# Patient Record
Sex: Female | Born: 1985 | Race: White | Hispanic: No | Marital: Single | Smoking: Never smoker
Health system: Southern US, Community
[De-identification: ages and names within clinical notes are randomized; demographics above are authoritative.]

## PROBLEM LIST (undated history)

## (undated) DIAGNOSIS — T8859XA Other complications of anesthesia, initial encounter: Secondary | ICD-10-CM

## (undated) DIAGNOSIS — K219 Gastro-esophageal reflux disease without esophagitis: Secondary | ICD-10-CM

## (undated) DIAGNOSIS — T4145XA Adverse effect of unspecified anesthetic, initial encounter: Secondary | ICD-10-CM

---
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
## 2006-02-05 ENCOUNTER — Inpatient Hospital Stay: Payer: Self-pay | Admitting: Obstetrics and Gynecology

## 2015-01-15 NOTE — H&P (Signed)
Brittany Oconnell is a 29 y.o. female presenting for elective repeat c/s + BTL + on Q pump. History prior c[facility]s . [facility] and confirmatory U/S .  OB History    No data available     No past medical history on file.- none  No past surgical history on file. Prior c/s   Family History: family history is not on file. Social History:  has no tobacco, alcohol, and drug history on file.  Meds : pnv Allergies : allergy    Prenatal Transfer Tool  Maternal Diabetes: No Genetic Screening: Declined Maternal Ultrasounds/Referrals: Normal Fetal Ultrasounds or other Referrals:  None Maternal Substance Abuse:  No Significant Maternal Medications:  None Significant Maternal Lab Results:  None Other Comments:  None  ROSunremarkable     There were no vitals taken for this visit. Exam Physical Exam BP 109/71 BMI - 36.2 Lungs CTA  CV RRR without murmur GI : gravid , soft NT  Pelvic : cx closed  Prenatal labs: ABO, Rh:  O+ Antibody:  neg Rubella:  Immune RPR:   NR HBsAg:   neg HIV:   neg GBS:     Assessment/Plan: Elective repeat LTCS and sterilization  02/11/15. On Q pump     SCHERMERHORN,THOMAS 01/15/2015, 11:08 AM

## 2015-02-08 ENCOUNTER — Encounter
Admission: RE | Admit: 2015-02-08 | Discharge: 2015-02-08 | Disposition: A | Discharge status: Home / Self Care | Payer: 59 | Source: Ambulatory Visit | Attending: Obstetrics and Gynecology | Admitting: Obstetrics and Gynecology

## 2015-02-08 ENCOUNTER — Other Ambulatory Visit: Payer: Self-pay

## 2015-02-08 HISTORY — DX: Gastro-esophageal reflux disease without esophagitis: K21.9

## 2015-02-08 HISTORY — DX: Adverse effect of unspecified anesthetic, initial encounter: T41.45XA

## 2015-02-08 HISTORY — DX: Other complications of anesthesia, initial encounter: T88.59XA

## 2015-02-08 LAB — RAPID HIV SCREEN (HIV 1/2 AB+AG)
HIV 1/2 ANTIBODIES: NONREACTIVE
HIV-1 P24 ANTIGEN - HIV24: NONREACTIVE

## 2015-02-08 LAB — CBC
HCT: 34.7 % — ABNORMAL LOW (ref 35.0–47.0)
HEMOGLOBIN: 11.8 g/dL — AB (ref 12.0–16.0)
MCH: 30.7 pg (ref 26.0–34.0)
MCHC: 33.9 g/dL (ref 32.0–36.0)
MCV: 90.3 fL (ref 80.0–100.0)
PLATELETS: 127 10*3/uL — AB (ref 150–440)
RBC: 3.84 MIL/uL (ref 3.80–5.20)
RDW: 14.2 % (ref 11.5–14.5)
WBC: 9.1 10*3/uL (ref 3.6–11.0)

## 2015-02-08 LAB — TYPE AND SCREEN
ABO/RH(D): O POS
Antibody Screen: NEGATIVE
EXTEND SAMPLE REASON: UNDETERMINED

## 2015-02-08 LAB — ABO/RH: ABO/RH(D): O POS

## 2015-02-09 LAB — RPR: RPR: NONREACTIVE

## 2015-02-11 ENCOUNTER — Inpatient Hospital Stay: Payer: 59 | Admitting: Anesthesiology

## 2015-02-11 ENCOUNTER — Encounter: Payer: Self-pay | Admitting: Anesthesiology

## 2015-02-11 ENCOUNTER — Inpatient Hospital Stay
Admission: RE | Admit: 2015-02-11 | Discharge: 2015-02-17 | DRG: 765 | Disposition: A | Discharge status: Home / Self Care | Payer: 59 | Source: Ambulatory Visit | Attending: Obstetrics and Gynecology | Admitting: Obstetrics and Gynecology

## 2015-02-11 ENCOUNTER — Encounter
Admission: RE | Disposition: A | Discharge status: Home / Self Care | Payer: Self-pay | Source: Ambulatory Visit | Attending: Obstetrics and Gynecology

## 2015-02-11 DIAGNOSIS — E876 Hypokalemia: Secondary | ICD-10-CM | POA: Diagnosis present

## 2015-02-11 DIAGNOSIS — J9811 Atelectasis: Secondary | ICD-10-CM | POA: Diagnosis not present

## 2015-02-11 DIAGNOSIS — E873 Alkalosis: Secondary | ICD-10-CM | POA: Diagnosis not present

## 2015-02-11 DIAGNOSIS — E860 Dehydration: Secondary | ICD-10-CM | POA: Diagnosis not present

## 2015-02-11 DIAGNOSIS — O34211 Maternal care for low transverse scar from previous cesarean delivery: Principal | ICD-10-CM | POA: Diagnosis present

## 2015-02-11 DIAGNOSIS — K219 Gastro-esophageal reflux disease without esophagitis: Secondary | ICD-10-CM | POA: Diagnosis present

## 2015-02-11 DIAGNOSIS — O9902 Anemia complicating childbirth: Secondary | ICD-10-CM | POA: Diagnosis not present

## 2015-02-11 DIAGNOSIS — R109 Unspecified abdominal pain: Secondary | ICD-10-CM

## 2015-02-11 DIAGNOSIS — Z302 Encounter for sterilization: Secondary | ICD-10-CM | POA: Diagnosis not present

## 2015-02-11 DIAGNOSIS — O9962 Diseases of the digestive system complicating childbirth: Secondary | ICD-10-CM | POA: Diagnosis present

## 2015-02-11 DIAGNOSIS — O34219 Maternal care for unspecified type scar from previous cesarean delivery: Secondary | ICD-10-CM | POA: Diagnosis present

## 2015-02-11 DIAGNOSIS — K567 Ileus, unspecified: Secondary | ICD-10-CM | POA: Diagnosis not present

## 2015-02-11 DIAGNOSIS — R112 Nausea with vomiting, unspecified: Secondary | ICD-10-CM

## 2015-02-11 DIAGNOSIS — Z9889 Other specified postprocedural states: Secondary | ICD-10-CM

## 2015-02-11 DIAGNOSIS — I313 Pericardial effusion (noninflammatory): Secondary | ICD-10-CM | POA: Diagnosis not present

## 2015-02-11 DIAGNOSIS — D62 Acute posthemorrhagic anemia: Secondary | ICD-10-CM | POA: Diagnosis not present

## 2015-02-11 DIAGNOSIS — K9189 Other postprocedural complications and disorders of digestive system: Secondary | ICD-10-CM | POA: Diagnosis not present

## 2015-02-11 DIAGNOSIS — Z01818 Encounter for other preprocedural examination: Secondary | ICD-10-CM

## 2015-02-11 DIAGNOSIS — Z98891 History of uterine scar from previous surgery: Secondary | ICD-10-CM

## 2015-02-11 LAB — CBC
HEMATOCRIT: 32.6 % — AB (ref 35.0–47.0)
Hemoglobin: 10.9 g/dL — ABNORMAL LOW (ref 12.0–16.0)
MCH: 30.3 pg (ref 26.0–34.0)
MCHC: 33.3 g/dL (ref 32.0–36.0)
MCV: 91.1 fL (ref 80.0–100.0)
Platelets: 114 10*3/uL — ABNORMAL LOW (ref 150–440)
RBC: 3.58 MIL/uL — ABNORMAL LOW (ref 3.80–5.20)
RDW: 14 % (ref 11.5–14.5)
WBC: 7.3 10*3/uL (ref 3.6–11.0)

## 2015-02-11 LAB — RAPID HIV SCREEN (HIV 1/2 AB+AG)
HIV 1/2 ANTIBODIES: NONREACTIVE
HIV-1 P24 Antigen - HIV24: NONREACTIVE

## 2015-02-11 SURGERY — Surgical Case
Anesthesia: General | Site: Abdomen | Laterality: Bilateral | Wound class: Clean Contaminated

## 2015-02-11 MED ORDER — ONDANSETRON HCL 4 MG/2ML IJ SOLN: 4.0000 mg | Freq: Three times a day (TID) | INTRAMUSCULAR | Status: DC | PRN

## 2015-02-11 MED ORDER — LACTATED RINGERS IV SOLN: 500.0000 mL | INTRAVENOUS | Status: DC | PRN

## 2015-02-11 MED ORDER — NALBUPHINE HCL 10 MG/ML IJ SOLN: 5.0000 mg | Freq: Once | INTRAMUSCULAR | Status: DC | PRN

## 2015-02-11 MED ORDER — DIPHENHYDRAMINE HCL 25 MG PO CAPS: 25.0000 mg | ORAL_CAPSULE | ORAL | Status: DC | PRN

## 2015-02-11 MED ORDER — IBUPROFEN 600 MG PO TABS
600.0000 mg | ORAL_TABLET | Freq: Four times a day (QID) | ORAL | Status: DC
  Administered 2015-02-12 – 2015-02-13 (×6): 600 mg via ORAL
  Filled 2015-02-11 (×7): qty 1

## 2015-02-11 MED ORDER — ONDANSETRON HCL 4 MG/2ML IJ SOLN: 4.0000 mg | Freq: Four times a day (QID) | INTRAMUSCULAR | Status: DC | PRN

## 2015-02-11 MED ORDER — CEFAZOLIN SODIUM-DEXTROSE 2-3 GM-% IV SOLR
INTRAVENOUS | Status: AC
  Administered 2015-02-11: 2 g via INTRAVENOUS
  Filled 2015-02-11: qty 50

## 2015-02-11 MED ORDER — LACTATED RINGERS IV SOLN
500.0000 mL | INTRAVENOUS | Status: DC | PRN
  Administered 2015-02-11: 08:00:00 via INTRAVENOUS

## 2015-02-11 MED ORDER — LANOLIN HYDROUS EX OINT: 1.0000 "application " | TOPICAL_OINTMENT | CUTANEOUS | Status: DC | PRN

## 2015-02-11 MED ORDER — NALBUPHINE HCL 10 MG/ML IJ SOLN: 5.0000 mg | INTRAMUSCULAR | Status: DC | PRN

## 2015-02-11 MED ORDER — ACETAMINOPHEN 325 MG PO TABS: 650.0000 mg | ORAL_TABLET | ORAL | Status: DC | PRN

## 2015-02-11 MED ORDER — BUPIVACAINE 0.25 % ON-Q PUMP DUAL CATH 300 ML
INJECTION | Status: DC | PRN
  Administered 2015-02-11: 300 mL

## 2015-02-11 MED ORDER — PROMETHAZINE HCL 25 MG/ML IJ SOLN: 6.2500 mg | INTRAMUSCULAR | Status: DC | PRN

## 2015-02-11 MED ORDER — CITRIC ACID-SODIUM CITRATE 334-500 MG/5ML PO SOLN
30.0000 mL | ORAL | Status: DC | PRN
  Administered 2015-02-11: 30 mL via ORAL
  Filled 2015-02-11: qty 15

## 2015-02-11 MED ORDER — DIBUCAINE 1 % RE OINT: 1.0000 "application " | TOPICAL_OINTMENT | RECTAL | Status: DC | PRN

## 2015-02-11 MED ORDER — FENTANYL CITRATE (PF) 100 MCG/2ML IJ SOLN
25.0000 ug | INTRAMUSCULAR | Status: DC | PRN
  Administered 2015-02-11 (×2): 50 ug via INTRAVENOUS

## 2015-02-11 MED ORDER — DIPHENHYDRAMINE HCL 25 MG PO CAPS
25.0000 mg | ORAL_CAPSULE | Freq: Four times a day (QID) | ORAL | Status: DC | PRN
Start: 2015-02-11 — End: 2015-02-11

## 2015-02-11 MED ORDER — NALOXONE HCL 2 MG/2ML IJ SOSY: 1.0000 ug/kg/h | PREFILLED_SYRINGE | INTRAVENOUS | Status: DC | PRN

## 2015-02-11 MED ORDER — LIDOCAINE HCL (CARDIAC) 20 MG/ML IV SOLN
INTRAVENOUS | Status: DC | PRN
  Administered 2015-02-11: 40 mg via INTRAVENOUS

## 2015-02-11 MED ORDER — PENICILLIN G POTASSIUM 5000000 UNITS IJ SOLR: 5.0000 10*6.[IU] | Freq: Once | INTRAVENOUS | Status: DC

## 2015-02-11 MED ORDER — SODIUM CHLORIDE 0.9 % IJ SOLN: 3.0000 mL | INTRAMUSCULAR | Status: DC | PRN

## 2015-02-11 MED ORDER — BUPIVACAINE 0.25 % ON-Q PUMP SINGLE CATH 400 ML
400.0000 mL | INJECTION | Status: DC
  Filled 2015-02-11: qty 400

## 2015-02-11 MED ORDER — OXYTOCIN 40 UNITS IN LACTATED RINGERS INFUSION - SIMPLE MED
62.5000 mL/h | INTRAVENOUS | Status: DC
  Administered 2015-02-11: 62.5 mL/h via INTRAVENOUS
  Administered 2015-02-11: 40 mL via INTRAVENOUS
  Filled 2015-02-11: qty 1000

## 2015-02-11 MED ORDER — OXYCODONE-ACETAMINOPHEN 5-325 MG PO TABS
2.0000 | ORAL_TABLET | ORAL | Status: DC | PRN
  Administered 2015-02-12 – 2015-02-15 (×13): 2 via ORAL
  Filled 2015-02-11 (×14): qty 2

## 2015-02-11 MED ORDER — SUCCINYLCHOLINE CHLORIDE 20 MG/ML IJ SOLN
INTRAMUSCULAR | Status: DC | PRN
  Administered 2015-02-11: 100 mg via INTRAVENOUS

## 2015-02-11 MED ORDER — MORPHINE SULFATE (PF) 0.5 MG/ML IJ SOLN
INTRAMUSCULAR | Status: DC | PRN
  Administered 2015-02-11: .1 mg via INTRATHECAL

## 2015-02-11 MED ORDER — PRENATAL MULTIVITAMIN CH
1.0000 | ORAL_TABLET | Freq: Every day | ORAL | Status: DC
  Administered 2015-02-12 – 2015-02-15 (×3): 1 via ORAL
  Filled 2015-02-11 (×3): qty 1

## 2015-02-11 MED ORDER — PHENYLEPHRINE HCL 10 MG/ML IJ SOLN
INTRAMUSCULAR | Status: DC | PRN
  Administered 2015-02-11 (×3): 100 ug via INTRAVENOUS

## 2015-02-11 MED ORDER — MORPHINE SULFATE (PF) 2 MG/ML IV SOLN
2.0000 mg | INTRAVENOUS | Status: DC | PRN
Start: 2015-02-11 — End: 2015-02-12

## 2015-02-11 MED ORDER — OXYCODONE-ACETAMINOPHEN 5-325 MG PO TABS: 1.0000 | ORAL_TABLET | ORAL | Status: DC | PRN

## 2015-02-11 MED ORDER — PENICILLIN G POTASSIUM 5000000 UNITS IJ SOLR: 2.5000 10*6.[IU] | INTRAVENOUS | Status: DC

## 2015-02-11 MED ORDER — PROPOFOL 10 MG/ML IV BOLUS
INTRAVENOUS | Status: DC | PRN
  Administered 2015-02-11: 120 mg via INTRAVENOUS

## 2015-02-11 MED ORDER — OXYTOCIN 40 UNITS IN LACTATED RINGERS INFUSION - SIMPLE MED: 62.5000 mL/h | INTRAVENOUS | Status: DC

## 2015-02-11 MED ORDER — OXYCODONE-ACETAMINOPHEN 5-325 MG PO TABS: 2.0000 | ORAL_TABLET | ORAL | Status: DC | PRN

## 2015-02-11 MED ORDER — CEFAZOLIN SODIUM-DEXTROSE 2-3 GM-% IV SOLR: 2.0000 g | Freq: Once | INTRAVENOUS | Status: DC

## 2015-02-11 MED ORDER — KETOROLAC TROMETHAMINE 30 MG/ML IJ SOLN
30.0000 mg | Freq: Four times a day (QID) | INTRAMUSCULAR | Status: DC | PRN
  Administered 2015-02-11 – 2015-02-12 (×4): 30 mg via INTRAVENOUS
  Filled 2015-02-11 (×4): qty 1

## 2015-02-11 MED ORDER — NALOXONE HCL 0.4 MG/ML IJ SOLN: 0.4000 mg | INTRAMUSCULAR | Status: DC | PRN

## 2015-02-11 MED ORDER — DIPHENHYDRAMINE HCL 50 MG/ML IJ SOLN: 12.5000 mg | INTRAMUSCULAR | Status: DC | PRN

## 2015-02-11 MED ORDER — CEFAZOLIN SODIUM-DEXTROSE 2-3 GM-% IV SOLR
2.0000 g | Freq: Once | INTRAVENOUS | Status: AC
  Administered 2015-02-11 (×2): 2 g via INTRAVENOUS

## 2015-02-11 MED ORDER — MENTHOL 3 MG MT LOZG: 1.0000 | LOZENGE | OROMUCOSAL | Status: DC | PRN

## 2015-02-11 MED ORDER — CITRIC ACID-SODIUM CITRATE 334-500 MG/5ML PO SOLN: 30.0000 mL | ORAL | Status: DC | PRN

## 2015-02-11 MED ORDER — MIDAZOLAM HCL 2 MG/2ML IJ SOLN
INTRAMUSCULAR | Status: DC | PRN
  Administered 2015-02-11: 1 mg via INTRAVENOUS

## 2015-02-11 MED ORDER — OXYTOCIN BOLUS FROM INFUSION: 500.0000 mL | INTRAVENOUS | Status: DC

## 2015-02-11 MED ORDER — TETANUS-DIPHTH-ACELL PERTUSSIS 5-2.5-18.5 LF-MCG/0.5 IM SUSP
0.5000 mL | Freq: Once | INTRAMUSCULAR | Status: DC
Start: 2015-02-12 — End: 2015-02-17

## 2015-02-11 MED ORDER — SIMETHICONE 80 MG PO CHEW: 80.0000 mg | CHEWABLE_TABLET | ORAL | Status: DC

## 2015-02-11 MED ORDER — OXYTOCIN BOLUS FROM INFUSION
500.0000 mL | INTRAVENOUS | Status: DC
  Filled 2015-02-11: qty 500

## 2015-02-11 MED ORDER — KETOROLAC TROMETHAMINE 30 MG/ML IJ SOLN: 30.0000 mg | Freq: Four times a day (QID) | INTRAMUSCULAR | Status: DC | PRN

## 2015-02-11 MED ORDER — ONDANSETRON HCL 4 MG/2ML IJ SOLN
4.0000 mg | Freq: Four times a day (QID) | INTRAMUSCULAR | Status: DC | PRN
  Administered 2015-02-11: 4 mg via INTRAVENOUS

## 2015-02-11 MED ORDER — OXYCODONE-ACETAMINOPHEN 5-325 MG PO TABS
1.0000 | ORAL_TABLET | ORAL | Status: DC | PRN
  Administered 2015-02-14 – 2015-02-15 (×3): 1 via ORAL
  Filled 2015-02-11 (×3): qty 1

## 2015-02-11 MED ORDER — LACTATED RINGERS IV SOLN: Freq: Once | INTRAVENOUS | Status: DC

## 2015-02-11 MED ORDER — LACTATED RINGERS IV SOLN: INTRAVENOUS | Status: DC

## 2015-02-11 MED ORDER — SENNOSIDES-DOCUSATE SODIUM 8.6-50 MG PO TABS
2.0000 | ORAL_TABLET | ORAL | Status: DC
  Administered 2015-02-12 – 2015-02-15 (×3): 2 via ORAL
  Filled 2015-02-11 (×4): qty 2

## 2015-02-11 MED ORDER — CEFAZOLIN SODIUM 1-5 GM-% IV SOLN: 1.0000 g | Freq: Three times a day (TID) | INTRAVENOUS | Status: DC

## 2015-02-11 MED ORDER — LACTATED RINGERS IV SOLN
INTRAVENOUS | Status: DC
Start: 2015-02-11 — End: 2015-02-12

## 2015-02-11 MED ORDER — LACTATED RINGERS IV SOLN
INTRAVENOUS | Status: DC
Start: 2015-02-11 — End: 2015-02-11
  Administered 2015-02-11: 07:00:00 via INTRAVENOUS

## 2015-02-11 MED ORDER — BUPIVACAINE HCL (PF) 0.5 % IJ SOLN
INTRAMUSCULAR | Status: AC
  Filled 2015-02-11: qty 30

## 2015-02-11 MED ORDER — LACTATED RINGERS IV SOLN
INTRAVENOUS | Status: DC
  Administered 2015-02-11: 06:00:00 via INTRAVENOUS

## 2015-02-11 MED ORDER — FENTANYL CITRATE (PF) 100 MCG/2ML IJ SOLN
INTRAMUSCULAR | Status: AC
  Administered 2015-02-11: 50 ug via INTRAVENOUS
  Filled 2015-02-11: qty 2

## 2015-02-11 MED ORDER — ZOLPIDEM TARTRATE 5 MG PO TABS: 5.0000 mg | ORAL_TABLET | Freq: Every evening | ORAL | Status: DC | PRN

## 2015-02-11 MED ORDER — SIMETHICONE 80 MG PO CHEW
80.0000 mg | CHEWABLE_TABLET | ORAL | Status: DC | PRN
  Administered 2015-02-12 – 2015-02-14 (×7): 80 mg via ORAL
  Filled 2015-02-11 (×7): qty 1

## 2015-02-11 MED ORDER — BUTORPHANOL TARTRATE 1 MG/ML IJ SOLN: 1.0000 mg | INTRAMUSCULAR | Status: DC | PRN

## 2015-02-11 MED ORDER — BUPIVACAINE IN DEXTROSE 0.75-8.25 % IT SOLN
INTRATHECAL | Status: DC | PRN
  Administered 2015-02-11: 1.6 mL via INTRATHECAL

## 2015-02-11 MED ORDER — FENTANYL CITRATE (PF) 100 MCG/2ML IJ SOLN
INTRAMUSCULAR | Status: DC | PRN
  Administered 2015-02-11: 15 ug via INTRATHECAL
  Administered 2015-02-11: 50 ug via INTRAVENOUS

## 2015-02-11 MED ORDER — LIDOCAINE HCL (PF) 1 % IJ SOLN: 30.0000 mL | INTRAMUSCULAR | Status: DC | PRN

## 2015-02-11 MED ORDER — MEPERIDINE HCL 25 MG/ML IJ SOLN: 6.2500 mg | INTRAMUSCULAR | Status: DC | PRN

## 2015-02-11 MED ORDER — LACTATED RINGERS IV SOLN
INTRAVENOUS | Status: DC
  Administered 2015-02-12: 01:00:00 via INTRAVENOUS

## 2015-02-11 MED ORDER — WITCH HAZEL-GLYCERIN EX PADS: 1.0000 "application " | MEDICATED_PAD | CUTANEOUS | Status: DC | PRN

## 2015-02-11 MED ORDER — BUPIVACAINE 0.25 % ON-Q PUMP DUAL CATH 400 ML
INJECTION | Status: AC
  Filled 2015-02-11: qty 400

## 2015-02-11 MED ORDER — SIMETHICONE 80 MG PO CHEW
80.0000 mg | CHEWABLE_TABLET | Freq: Three times a day (TID) | ORAL | Status: DC
Start: 2015-02-11 — End: 2015-02-11

## 2015-02-11 SURGICAL SUPPLY — 22 items
BARRIER ADHS 3X4 INTERCEED (GAUZE/BANDAGES/DRESSINGS) ×3 IMPLANT
CANISTER SUCT 3000ML (MISCELLANEOUS) ×3 IMPLANT
CATH KIT ON-Q SILVERSOAK 5IN (CATHETERS) ×6 IMPLANT
CHLORAPREP W/TINT 26ML (MISCELLANEOUS) ×6 IMPLANT
DRSG TELFA 3X8 NADH (GAUZE/BANDAGES/DRESSINGS) ×3 IMPLANT
ELECT CAUTERY BLADE 6.4 (BLADE) ×3 IMPLANT
GAUZE SPONGE 4X4 12PLY STRL (GAUZE/BANDAGES/DRESSINGS) ×3 IMPLANT
GLOVE BIO SURGEON STRL SZ8 (GLOVE) ×3 IMPLANT
GOWN STRL REUS W/ TWL LRG LVL3 (GOWN DISPOSABLE) ×2 IMPLANT
GOWN STRL REUS W/ TWL XL LVL3 (GOWN DISPOSABLE) ×2 IMPLANT
GOWN STRL REUS W/TWL LRG LVL3 (GOWN DISPOSABLE) ×4
GOWN STRL REUS W/TWL XL LVL3 (GOWN DISPOSABLE) ×4
NS IRRIG 1000ML POUR BTL (IV SOLUTION) ×3 IMPLANT
PACK C SECTION AR (MISCELLANEOUS) ×3 IMPLANT
PAD GROUND ADULT SPLIT (MISCELLANEOUS) ×3 IMPLANT
PAD OB MATERNITY 4.3X12.25 (PERSONAL CARE ITEMS) ×3 IMPLANT
PAD PREP 24X41 OB/GYN DISP (PERSONAL CARE ITEMS) ×3 IMPLANT
SPONGE LAP 18X18 5 PK (GAUZE/BANDAGES/DRESSINGS) ×6 IMPLANT
STRAP SAFETY BODY (MISCELLANEOUS) ×3 IMPLANT
SUT CHROMIC 1 CTX 36 (SUTURE) ×9 IMPLANT
SUT PLAIN GUT 0 (SUTURE) ×6 IMPLANT
SUT VIC AB 0 CT1 36 (SUTURE) ×15 IMPLANT

## 2015-02-11 NOTE — Progress Notes (Signed)
Patient ID: Brittany Oconnell, female   DOB: 06-01-1985, 29 y.o.   MRN: [id number] Pt ready for surgery . Repeat c/s + BTL + On q pump

## 2015-02-11 NOTE — Progress Notes (Signed)
Spinal failed and GETA  APGARS 6/9 , FEMALE

## 2015-02-11 NOTE — Brief Op Note (Signed)
02/11/2015  8:51 AM  PATIENT:  Brittany Oconnell  29 y.o. female  PRE-OPERATIVE DIAGNOSIS:  REPEAT C SECTION,  POST-OPERATIVE DIAGNOSIS:  REPEAT C SECTION,  PROCEDURE:  Procedure(s) with comments: CESAREAN SECTION WITH BILATERAL TUBAL LIGATION (Bilateral) - on q pump  SURGEON:  Surgeon(s) and Role:    Suzy Bouchard* Thomas J Schermerhorn, MD - Primary  PHYSICIAN ASSISTANT:Sigmon , m  CNM   ASSISTANTS: none   ANESTHESIA:   general( failed spinal )   EBL:  Total I/O In: 1500 [I.V.:1500] Out: - EBL 750 cc  BLOOD ADMINISTERED:none  DRAINS:foley LOCAL MEDICATIONS USED:  MARCAINE     SPECIMEN:  Source of Specimen:  portion right + left tube   DISPOSITION OF SPECIMEN:  PATHOLOGY  COUNTS:  YES  TOURNIQUET:  * No tourniquets in log *  DICTATION: .Other Dictation: Dictation Number verbal   PLAN OF CARE: Admit to inpatient   PATIENT DISPOSITION:  PACU - hemodynamically stable.   Delay start of Pharmacological VTE agent (>24hrs) due to surgical blood loss or risk of bleeding: not applicable

## 2015-02-11 NOTE — Anesthesia Preprocedure Evaluation (Signed)
Anesthesia Evaluation  Patient identified by MRN, date of birth, ID band Patient awake    Reviewed: Allergy & Precautions, NPO status , Patient's Chart, lab work & pertinent test results  History of Anesthesia Complications (+) history of anesthetic complications  Airway Mallampati: II  TM Distance: >3 FB     Dental no notable dental hx.    Pulmonary neg pulmonary ROS,    Pulmonary exam normal        Cardiovascular negative cardio ROS Normal cardiovascular exam Rhythm:Regular Rate:Normal     Neuro/Psych negative neurological ROS  negative psych ROS   GI/Hepatic Neg liver ROS, GERD  Medicated and Controlled,  Endo/Other  negative endocrine ROS  Renal/GU negative Renal ROS  negative genitourinary   Musculoskeletal negative musculoskeletal ROS (+)   Abdominal Normal abdominal exam  (+) + obese,   Peds negative pediatric ROS (+)  Hematology negative hematology ROS (+)   Anesthesia Other Findings   Reproductive/Obstetrics                             Anesthesia Physical Anesthesia Plan  ASA: II  Anesthesia Plan: Spinal   Post-op Pain Management:    Induction: Intravenous  Airway Management Planned: Nasal Cannula  Additional Equipment:   Intra-op Plan:   Post-operative Plan:   Informed Consent: I have reviewed the patients History and Physical, chart, labs and discussed the procedure including the risks, benefits and alternatives for the proposed anesthesia with the patient or authorized representative who has indicated his/her understanding and acceptance.   Dental advisory given  Plan Discussed with: CRNA and Surgeon  Anesthesia Plan Comments:         Anesthesia Quick Evaluation

## 2015-02-11 NOTE — Op Note (Signed)
[name]:  Brittany Oconnell, Brittany Oconnell                   ACCOUNT NO.:  [id number]  MEDICAL RECORD NO.:  [id number]  LOCATION:  [facility]                        FACILITY:  [facility]  PHYSICIAN:  Jennell Cornerhomas Tuere Nwosu, MDDATE OF BIRTH:  1985-06-15  DATE OF PROCEDURE: DATE OF DISCHARGE:                              OPERATIVE REPORT   PREOPERATIVE DIAGNOSIS: 1. Elective repeat cesarean section. 2. Elective permanent sterilization.  POSTOPERATIVE DIAGNOSIS: 1. Elective repeat cesarean section. 2. Elective permanent sterilization.  PROCEDURE PERFORMED: 1. Repeat low-transverse cesarean section. 2. Lysis of adhesions. 3. Bilateral tubal ligation Pomeroy. 4. On-Q pump placement.  SURGEON:  Jennell Cornerhomas Folashade Gamboa, MD  ANESTHESIA:  Spinal (failed to general endotracheal anesthesia).  SURGEON:  Jennell Cornerhomas Cacey Willow, MD.  FIRST ASSISTANT:  Carlean JewsSigmon Meredith, certified nurse midwife.  INDICATIONS:  This is a 29 year old, gravida 2, para 1 patient at 39+ 0 weeks.  The patient had a prior cesarean section and elects for a repeat cesarean section and elects for permanent sterilization.  DESCRIPTION OF PROCEDURE:  After spinal anesthesia, the patient was placed in dorsal supine position.  Prepped and draped in normal sterile fashion.  The patient did receive 2 g of IV Ancef prior to commencement of the case.  Time-out was performed.  The procedure was started and the Pfannenstiel incision was made 2 fingerbreadths above the symphysis pubis.  Sharp dissection was used to identify the fascia.  Fascia was opened in the midline and opened in a transverse fashion.  The superior aspect of the fascia was grasped with Kocher clamps and the recti muscles dissected free.  The inferior aspect of the fascia was grasped with Kocher clamps and pyramidalis muscles dissected free.  Entry into the peritoneal cavity was accomplished sharply.  The patient at this point had significant pain.  Procedure was stopped at this  point. Anesthesiologist was called back into the room and after general endotracheal anesthesia, procedure proceeded.  Dense adhesions from the uterus to the anterior abdominal wall were taken down sharply and suture ligated with 0 Vicryl suture.  The vesicouterine peritoneal fold was identified and a bladder flap was created.  A low-transverse uterine incision was made.  Anterior placenta was encountered with brisk bleeding and blunt dissection was used to gain entrance into the endometrial cavity.  Copious clear fluid resulted.  The fetal head was brought to the incision and a vacuum was placed on the fetal occiput, with 1 gentle pull the head was delivered.  The vacuum was removed and the shoulders and body were delivered.  A vigorous female was passed to nursery staff who assigned Apgar scores of 6 and 9.  The placenta was manually delivered and the uterus was exteriorized.  The endometrial cavity was wiped clean with laparotomy tape and the cervix was opened with a ring forceps.  Uterine incision was closed with 1 chromic suture in a running locking fashion.  Good hemostasis noted.  Posterior cul-de- sac was suction irrigated and the attention was then directed to the patient's right fallopian tube that was grasped with the midportion, 2 separate 0 plain gut sutures were placed at the midportion of the fallopian tube and a 1.5 cm portion of fallopian tube was removed.  Good hemostasis was noted.  Similar procedure was repeated on the patient's left fallopian tube.  Again, 2 separate 0 plain gut sutures were applied in the mid portion of fallopian tube and 1.5 cm portion of fallopian tube was removed.  Good hemostasis was noted.  The uterus was placed back into the abdominal cavity and the pericolic gutters were wiped clean with laparotomy tape and each tubal ligation site appeared hemostatic.  Uterine incision appeared hemostatic.  Interceed was placed over the uterine incision in a  T-shaped fashion.  Attention was then directed to the superior aspect of the fascia which was regressed and On- Q pump catheters were advanced from infraumbilical position to a subfascial position.  Fascia was closed over top of these catheters with 0 Vicryl suture and closed in a running nonlocking fashion.  Good approximation of edges noted.  Subcutaneous tissues were irrigated and bovied for hemostasis.  The skin was reapproximated with staples and the On-Q pump catheters were secured at the skin level with LiquiBand and Steri-Strips and a Tegaderm was placed over top of these catheters. Each catheter was loaded with 5 mL of 0.5% Marcaine.  There were no complications.  Estimated blood loss 700 mL.  Intraop fluids 1500 mL. The patient tolerated the procedure well and was taken to recovery room in good condition.          ______________________________ Jennell Corner, MD     TS/MEDQ  D:  02/11/2015  T:  02/11/2015  Job:  [id number]

## 2015-02-11 NOTE — Anesthesia Procedure Notes (Addendum)
Date/Time: 02/11/2015 7:50 AM Performed by: Allean Found Pre-anesthesia Checklist: Patient identified, Emergency Drugs available, Suction available, Patient being monitored and Timeout performed Patient Re-evaluated:Patient Re-evaluated prior to inductionOxygen Delivery Method: Nasal cannula    Spinal Patient location during procedure: OR Start time: 02/11/2015 7:48 AM End time: 02/11/2015 7:51 AM Staffing Anesthesiologist: Martha Clan Performed by: anesthesiologist  Preanesthetic Checklist Completed: patient identified, site marked, surgical consent, pre-op evaluation, timeout performed, IV checked, risks and benefits discussed and monitors and equipment checked Spinal Block Patient position: sitting Prep: Betadine Patient monitoring: heart rate, continuous pulse ox, blood pressure and cardiac monitor Approach: midline Location: L3-4 Injection technique: single-shot Needle Needle type: Whitacre and Introducer  Needle gauge: 24 G Needle length: 9 cm Additional Notes Negative paresthesia. Negative blood return. Positive free-flowing CSF. Expiration date of kit checked and confirmed. Patient tolerated procedure well, without complications.    Procedure Name: Intubation Date/Time: 02/11/2015 8:11 AM Performed by: Johnna Acosta Pre-anesthesia Checklist: Patient identified, Emergency Drugs available, Suction available, Patient being monitored and Timeout performed Patient Re-evaluated:Patient Re-evaluated prior to inductionOxygen Delivery Method: Circle system utilized Preoxygenation: Pre-oxygenation with 100% oxygen Intubation Type: IV induction Ventilation: Mask ventilation without difficulty Laryngoscope Size: Mac and 3 Grade View: Grade I Tube type: Oral Tube size: 7.0 mm Number of attempts: 1 Placement Confirmation: ETT inserted through vocal cords under direct vision and positive ETCO2 Secured at: 20 cm Tube secured with: Tape Comments: Intubated by K. Pope  CRNA

## 2015-02-11 NOTE — Transfer of Care (Signed)
Immediate Anesthesia Transfer of Care Note  Patient: Brittany Oconnell  Procedure(s) Performed: Procedure(s) with comments: CESAREAN SECTION WITH BILATERAL TUBAL LIGATION (Bilateral) - on q pump  Patient Location: PACU  Anesthesia Type:General  Level of Consciousness: awake  Airway & Oxygen Therapy: Patient Spontanous Breathing and Patient connected to nasal cannula oxygen  Post-op Assessment: Report given to RN and Post -op Vital signs reviewed and stable  Post vital signs: Reviewed and stable  Last Vitals:  Filed Vitals:   02/11/15 0901 02/11/15 0911  BP:  114/71  Pulse:  84  Temp:  36.3 C  Resp: 1 16    Complications: No apparent anesthesia complications

## 2015-02-12 LAB — CBC
HCT: 25.3 % — ABNORMAL LOW (ref 35.0–47.0)
HEMOGLOBIN: 8.5 g/dL — AB (ref 12.0–16.0)
MCH: 30.3 pg (ref 26.0–34.0)
MCHC: 33.5 g/dL (ref 32.0–36.0)
MCV: 90.6 fL (ref 80.0–100.0)
Platelets: 102 10*3/uL — ABNORMAL LOW (ref 150–440)
RBC: 2.79 MIL/uL — AB (ref 3.80–5.20)
RDW: 14.3 % (ref 11.5–14.5)
WBC: 11.6 10*3/uL — AB (ref 3.6–11.0)

## 2015-02-12 LAB — SURGICAL PATHOLOGY

## 2015-02-12 LAB — HIV ANTIBODY (ROUTINE TESTING W REFLEX): HIV Screen 4th Generation wRfx: NONREACTIVE

## 2015-02-12 LAB — RPR: RPR: NONREACTIVE

## 2015-02-12 MED ORDER — FERROUS SULFATE 325 (65 FE) MG PO TABS
325.0000 mg | ORAL_TABLET | Freq: Two times a day (BID) | ORAL | Status: DC
  Administered 2015-02-12 – 2015-02-15 (×5): 325 mg via ORAL
  Filled 2015-02-12 (×6): qty 1

## 2015-02-12 NOTE — Anesthesia Postprocedure Evaluation (Signed)
Anesthesia Post Note  Patient: Brittany Oconnell  Procedure(s) Performed: Procedure(s) (LRB): CESAREAN SECTION WITH BILATERAL TUBAL LIGATION (Bilateral)  Patient location during evaluation: Mother Baby Anesthesia Type: General and Spinal Level of consciousness: awake and alert, oriented and patient cooperative Pain management: pain level controlled Vital Signs Assessment: post-procedure vital signs reviewed and stable Respiratory status: spontaneous breathing, nonlabored ventilation and respiratory function stable Cardiovascular status: blood pressure returned to baseline and stable Postop Assessment: No signs of nausea or vomiting and No headache Anesthetic complications: no    Last Vitals:  Filed Vitals:   02/12/15 0050 02/12/15 0540  BP: 122/75 117/72  Pulse: 97 90  Temp: 37.2 C 36.9 C  Resp: 18 17    Last Pain:  Filed Vitals:   02/12/15 0540  PainSc: 0-No pain                 Starling Mannsurtis,  Nel Stoneking A

## 2015-02-12 NOTE — Progress Notes (Signed)
POSTOPERATIVE DAY # 1 S/P Repeat LTCS and BTL   S:         Reports feeling good, having some soreness             Tolerating po intake / no nausea / no vomiting / no flatus / no BM             Bleeding is light             Pain controlled withOn-Q pump, Motrin, Percocet             Up ad lib / ambulatory/ foley catheter removed this morning - has not voided yet  Newborn breast feeding- going well    O:  VS: BP 109/73 mmHg  Pulse 88  Temp(Src) 98.5 F (36.9 C) (Oral)  Resp 18  SpO2 98%  LMP 05/14/2014  Breastfeeding? Unknown   LABS:               Recent Labs  02/11/15 0638 02/12/15 0634  WBC 7.3 11.6*  HGB 10.9* 8.5*  PLT 114* 102*               Bloodtype: --/--/O POS (11/18 1056)  Rubella:                                               I&O: Intake/Output      11/21 0701 - 11/22 0700 11/22 0701 - 11/23 0700   I.V. 1500    Total Intake 1500     Urine 900 700   Blood 700    Total Output 1600 700   Net -100 -700                     Physical Exam:             Alert and Oriented X3  Lungs: Clear and unlabored  Heart: regular rate and rhythm / no mumurs  Abdomen: soft, non-tender, non-distended, bowel sounds present in all quadrants / On Q pump intact / no erythema/ no drainage             Fundus: firm, non-tender, U-1             Dressing: pressure dressing c/d/t              Incision:  approximated with staples / unable to visualize d/t pressure dressing   Perineum: intact  Lochia: light / no clots  Extremities: slight +1 dependent edema, no calf pain or tenderness, negative Homans  A:        POD # 1 S/P Repeat LTCS and BTL            ABL Anemia   P:        Routine postoperative care              Begin Ferrous Sulfate BID  May SL IV   May shower today   Carlean JewsMeredith Mynor [place], [place]M

## 2015-02-12 NOTE — Anesthesia Post-op Follow-up Note (Signed)
  Anesthesia Pain Follow-up Note  Patient: Brittany Oconnell  Day #: 1  Date of Follow-up: 02/12/2015 Time: 7:34 AM  Last Vitals:  Filed Vitals:   02/12/15 0050 02/12/15 0540  BP: 122/75 117/72  Pulse: 97 90  Temp: 37.2 C 36.9 C  Resp: 18 17    Level of Consciousness: alert  Pain: 4 /10   Side Effects:None  Catheter Site Exam:clean, dry, no drainage  Plan: D/C from anesthesia care  Starling Mannsurtis,  [place] Hilligoss A

## 2015-02-13 MED ORDER — IBUPROFEN 600 MG PO TABS
600.0000 mg | ORAL_TABLET | Freq: Four times a day (QID) | ORAL | Status: DC
  Administered 2015-02-14 – 2015-02-15 (×6): 600 mg via ORAL
  Filled 2015-02-13 (×7): qty 1

## 2015-02-13 MED ORDER — IBUPROFEN 600 MG PO TABS
600.0000 mg | ORAL_TABLET | Freq: Four times a day (QID) | ORAL | Status: DC
  Administered 2015-02-13: 600 mg via ORAL

## 2015-02-13 NOTE — Lactation Note (Signed)
This note was copied from the chart of Brittany Jawanda Latner. Lactation Consultation Note  Patient Name: Brittany Oconnell ZOXWR'UToday's Date: 02/13/2015 Reason for consult: Follow-up assessment   Maternal Data Does the patient have breastfeeding experience prior to this delivery?: Yes  Feeding Feeding Type: Breast Fed Length of feed: 45 min Baby at 9% weight loss; mom reports baby actively sucking only a few minutes per feeding (out of 20-45 minutes of trying since birth); c/o very fussy baby; dry mouth; mom exhausted (no sleep in 3 days); has been vomiting today and struggling with pain control; requested formula due to all these valid reasons. Mom unable to pump or express milk at this time. 10 ml Similac given via curved syringe as finger feed; taken quite well. Baby fell asleep after feeding. Mom to call me after she sleeps for a couple hours for feeding assessment/assist LATCH Score/Interventions                      Lactation Tools Discussed/Used Tools:  (curved syringe)   Consult Status      Sunday CornSandra Clark Jazia Faraci 02/13/2015, 1:39 PM

## 2015-02-13 NOTE — Progress Notes (Signed)
Subjective: Postpartum Day 2: Cesarean Delivery Patient reports no problems voiding.    Objective: Vital signs in last 24 hours: Temp:  [98 F (36.7 C)-99 F (37.2 C)] 98.2 F (36.8 C) (11/23 0755) Pulse Rate:  [86-121] 108 (11/23 0755) Resp:  [18-20] 18 (11/23 0755) BP: (106-139)/(61-88) 139/85 mmHg (11/23 0755) SpO2:  [97 %-98 %] 97 % (11/23 0755)  Physical Exam:  General: alert and cooperative Lochia: appropriate Uterine Fundus: firm Incision: healing well DVT Evaluation: No evidence of DVT seen on physical exam.   Recent Labs  02/11/15 0638 02/12/15 0634  HGB 10.9* 8.5*  HCT 32.6* 25.3*    Assessment/Plan: Status post Cesarean section. Doing well postoperatively.  Continue current care.  Brittany Oconnell 02/13/2015, 8:09 AM

## 2015-02-14 ENCOUNTER — Inpatient Hospital Stay: Payer: 59

## 2015-02-14 LAB — CBC
HEMATOCRIT: 32.9 % — AB (ref 35.0–47.0)
Hemoglobin: 10.9 g/dL — ABNORMAL LOW (ref 12.0–16.0)
MCH: 30 pg (ref 26.0–34.0)
MCHC: 33.3 g/dL (ref 32.0–36.0)
MCV: 90.1 fL (ref 80.0–100.0)
Platelets: 239 10*3/uL (ref 150–440)
RBC: 3.65 MIL/uL — AB (ref 3.80–5.20)
RDW: 14.3 % (ref 11.5–14.5)
WBC: 2.7 10*3/uL — AB (ref 3.6–11.0)

## 2015-02-14 LAB — COMPREHENSIVE METABOLIC PANEL
ALT: 42 U/L (ref 14–54)
AST: 52 U/L — AB (ref 15–41)
Albumin: 2.3 g/dL — ABNORMAL LOW (ref 3.5–5.0)
Alkaline Phosphatase: 58 U/L (ref 38–126)
Anion gap: 7 (ref 5–15)
BUN: 25 mg/dL — AB (ref 6–20)
CHLORIDE: 104 mmol/L (ref 101–111)
CO2: 25 mmol/L (ref 22–32)
Calcium: 7.5 mg/dL — ABNORMAL LOW (ref 8.9–10.3)
Creatinine, Ser: 0.78 mg/dL (ref 0.44–1.00)
GFR calc Af Amer: >60 mL/min (ref >60)
Glucose, Bld: 125 mg/dL — ABNORMAL HIGH (ref 65–99)
POTASSIUM: 3.6 mmol/L (ref 3.5–5.1)
SODIUM: 136 mmol/L (ref 135–145)
Total Bilirubin: 0.5 mg/dL (ref 0.3–1.2)
Total Protein: 5.6 g/dL — ABNORMAL LOW (ref 6.5–8.1)

## 2015-02-14 LAB — AMYLASE: Amylase: 31 U/L (ref 28–100)

## 2015-02-14 LAB — LIPASE, BLOOD: Lipase: 18 U/L (ref 11–51)

## 2015-02-14 MED ORDER — SODIUM CHLORIDE 0.9 % IV BOLUS (SEPSIS)
500.0000 mL | Freq: Once | INTRAVENOUS | Status: AC
  Administered 2015-02-14: 500 mL via INTRAVENOUS

## 2015-02-14 MED ORDER — ONDANSETRON HCL 4 MG/2ML IJ SOLN
INTRAMUSCULAR | Status: AC
  Administered 2015-02-14: 14:00:00
  Filled 2015-02-14: qty 2

## 2015-02-14 MED ORDER — BISACODYL 10 MG RE SUPP
10.0000 mg | Freq: Once | RECTAL | Status: AC
  Administered 2015-02-14: 10 mg via RECTAL
  Filled 2015-02-14: qty 1

## 2015-02-14 MED ORDER — KCL-LACTATED RINGERS-D5W 20 MEQ/L IV SOLN
INTRAVENOUS | Status: DC
  Administered 2015-02-14: 11:00:00 via INTRAVENOUS
  Filled 2015-02-14 (×4): qty 1000

## 2015-02-14 MED ORDER — PROMETHAZINE HCL 25 MG/ML IJ SOLN
25.0000 mg | Freq: Once | INTRAMUSCULAR | Status: AC | PRN
  Administered 2015-02-14: 25 mg via INTRAMUSCULAR
  Filled 2015-02-14: qty 1

## 2015-02-14 MED ORDER — ONDANSETRON 8 MG PO TBDP
8.0000 mg | ORAL_TABLET | Freq: Three times a day (TID) | ORAL | Status: DC | PRN
  Administered 2015-02-14 (×2): 8 mg via ORAL
  Filled 2015-02-14 (×4): qty 1

## 2015-02-14 MED ORDER — FLEET ENEMA 7-19 GM/118ML RE ENEM
1.0000 | ENEMA | Freq: Once | RECTAL | Status: AC
  Administered 2015-02-14: 1 via RECTAL

## 2015-02-14 MED ORDER — MORPHINE SULFATE (PF) 2 MG/ML IV SOLN: 2.0000 mg | INTRAVENOUS | Status: DC | PRN

## 2015-02-14 MED ORDER — SODIUM CHLORIDE 0.9 % IV BOLUS (SEPSIS)
1000.0000 mL | INTRAVENOUS | Status: DC | PRN
  Administered 2015-02-14: 1000 mL via INTRAVENOUS
  Filled 2015-02-14: qty 1000

## 2015-02-14 MED ORDER — ONDANSETRON HCL 4 MG/2ML IJ SOLN
4.0000 mg | INTRAMUSCULAR | Status: DC
  Administered 2015-02-14 – 2015-02-15 (×5): 4 mg via INTRAVENOUS
  Filled 2015-02-14 (×4): qty 2

## 2015-02-14 MED ORDER — SODIUM CHLORIDE 0.9 % IV SOLN
INTRAVENOUS | Status: AC
  Administered 2015-02-14 – 2015-02-15 (×3): via INTRAVENOUS

## 2015-02-14 MED ORDER — PANTOPRAZOLE SODIUM 40 MG IV SOLR
40.0000 mg | INTRAVENOUS | Status: DC
  Administered 2015-02-14 – 2015-02-16 (×3): 40 mg via INTRAVENOUS
  Filled 2015-02-14 (×6): qty 40

## 2015-02-14 NOTE — Consult Note (Signed)
Medical Consultation  Brittany Oconnell ZOX:[id number] DOB: Nov 19, 1985 DOA: 02/11/2015 PCP: No PCP Per Patient   Requesting physician: Dr Feliberto Gottron Date of consultation: 02/14/2015 Reason for consultation: Abdominal pain  Impression/Recommendations  29 year old female postpartum day 3 after C-section who has a postop ileus.  1. Postop ileus: I suspect this is due to use of narcotics and constipation. Patient is unable to take any by mouth's. I will place her nothing by mouth. I will try enema and suppository.  Her potassium level this morning was 3.6. KUB can be ordered for the a.m.  2..pericardial effusion seen on abdominal ultrasound: Echocardiogram has been ordered for the a.m. to better evaluate if the patient does indeed have a pericardial effusion.  3. Atelectasis on chest x-ray: Patient does not appear to have symptoms of hypoxia or pain on inspiration. Incentive spirometer has been ordered to help alleviate atelectasis.   Chief Complaint: Abdominal pain with nausea and vomiting  HPI:  This is a very pleasant 29 year old female who is postop day #3 after C-section and has ongoing nausea and is unable to take in by mouth's. Patient reports taking Percocet for her abdominal pain after C-section. She has not had a bowel movement since Monday. She is complaining of abdominal pain and nausea.  Review of Systems  Constitutional: Negative for fever, chills  HENT: Negative for ear pain, nosebleeds, congestion, facial swelling, rhinorrhea, neck pain, neck stiffness and ear discharge.   Respiratory: Negative for cough, shortness of breath, wheezing  Cardiovascular: Negative for chest pain, palpitations and leg swelling.  Gastrointestinal: Negative for heartburn, +++++abdominal pain, ++++vomiting,++nausea NO diarrhea or consitpation Genitourinary: Negative for dysuria, urgency, frequency, hematuria Musculoskeletal: Negative for back pain or joint pain Neurological: Negative for dizziness,  seizures, syncope, focal weakness,  numbness and headaches.  Hematological: Does not bruise/bleed easily.  Psychiatric/Behavioral: Negative for hallucinations, confusion, dysphoric mood   Past Medical History  Diagnosis Date  . GERD (gastroesophageal reflux disease)     with pregnancy  . Complication of anesthesia     needed more medication with first c-section   Past Surgical History  Procedure Laterality Date  . Cesarean section    . Cesarean section with bilateral tubal ligation Bilateral 02/11/2015    Procedure: CESAREAN SECTION WITH BILATERAL TUBAL LIGATION;  Surgeon: Suzy Bouchard, MD;  Location: [facility] ORS;  Service: Obstetrics;  Laterality: Bilateral;  on q pump   Social History:  reports that she has never smoked. She has never used smokeless tobacco. She reports that she does not drink alcohol or use illicit drugs.  No Known Allergies Family history: No history of CAD or hypertension  Prior to Admission medictions   Ferrous sulfate 325 mm by mouth twice a day     Physical Exam: Blood pressure 139/85, pulse 128, temperature 98.9 F (37.2 C), temperature source Oral, resp. rate 18, height  (1.626 m), weight 99.338 kg (219 lb), last menstrual period 05/14/2014, SpO2 98 %, unknown if currently breastfeeding. @ Filed Weights   02/13/15 1930  Weight: 99.338 kg (219 lb)    Intake/Output Summary (Last 24 hours) at 02/14/15 1538 Last data filed at 02/13/15 1820  Gross per 24 hour  Intake      0 ml  Output      0 ml  Net      0 ml     Constitutional: Appears well-developed and well-nourished. No distress. HENT: Normocephalic. Marland Kitchen Oropharynx is clear and moist.  Eyes: Conjunctivae and EOM are normal. PERRLA, no scleral icterus.  Neck: Normal ROM. Neck supple. No JVD. No tracheal deviation. CVS: RRR, S1/S2 +, no murmurs, no gallops, no carotid bruit.  Pulmonary: Effort and breath sounds normal, no stridor, rhonchi, wheezes, rales.  Abdominal: She has  hypoactive bowel sounds with generalized tenderness no rebound or guarding. Her surgical site is intact and staples are intact and clean without evidence of infection.  Musculoskeletal: Normal range of motion. No edema and no tenderness.  Neuro: Alert. CN 2-12 grossly intact. No focal deficits. Skin: Skin is warm and dry. No rash noted. Psychiatric: Normal mood and affect.    Labs  Basic Metabolic Panel:  Recent Labs Lab 02/14/15 1129  NA 136  K 3.6  CL 104  CO2 25  GLUCOSE 125*  BUN 25*  CREATININE 0.78  CALCIUM 7.5*   Liver Function Tests:  Recent Labs Lab 02/14/15 1129  AST 52*  ALT 42  ALKPHOS 58  BILITOT 0.5  PROT 5.6*  ALBUMIN 2.3*    Recent Labs Lab 02/14/15 1129  LIPASE 18  AMYLASE 31    CBC:  Recent Labs Lab 02/14/15 1129  WBC 2.7*  HGB 10.9*  HCT 32.9*  MCV 90.1  PLT 239   Cardiac Enzymes: No results for input(s): CKTOTAL, CKMB, CKMBINDEX, TROPONINI in the last 168 hours. BNP: Invalid input(s): POCBNP CBG: No results for input(s): GLUCAP in the last 168 hours.  Radiological Exams: Dg Chest 2 View  02/14/2015  CLINICAL DATA:  Recent C-section EXAM: CHEST - 2 VIEW COMPARISON:  None. FINDINGS: Cardiac shadow is within normal limits. The lungs are well aerated bilaterally. Minimal left upper lobe atelectasis is noted. Tiny pleural effusions are noted bilaterally in the posterior costophrenic angles. The upper abdomen demonstrates multiple dilated loops of small bowel with air-fluid levels likely related to a small-bowel ileus. Correlation with the physical exam is recommended. IMPRESSION: Minimal left upper lobe atelectasis. Tiny pleural effusions bilaterally. Changes suggestive of a small-bowel ileus. Clinical correlation is recommended. Electronically Signed   By: Alcide Clever M.D.   On: 02/14/2015 14:24   US Abdomen Limited Ruq  02/14/2015  CLINICAL DATA:  Upper abdominal tenderness, nausea, vomiting. C-section 4 days ago. EXAM: US ABDOMEN  LIMITED - RIGHT UPPER QUADRANT COMPARISON:  None. FINDINGS: Gallbladder: Gallbladder is mildly distended but otherwise unremarkable. No gallstones seen. No gallbladder wall thickening or pericholecystic fluid. No sonographic Murphy's sign elicited, per the sonographer. Common bile duct: Diameter: Within normal limits at 4 mm. Liver: No focal lesion identified. Within normal limits in parenchymal echogenicity. Small amount of ascites noted throughout the abdomen. Pericardial fluid noted at the heart base. Pleural fluid noted at the right lung base. Mildly prominent fluid-filled small bowel loops noted within the lower abdomen, perhaps a post surgical ileus. IMPRESSION: 1. Gallbladder appears normal. No evidence of cholecystitis. No bile duct dilatation. 2. Liver appears normal. 3. Small amount of ascites. 4. Pericardial fluid noted at the heart base, of uncertain amount. Would consider correlation with ECHO. 5. Pleural effusion at the right lung base, also of uncertain amount. 6. Mildly prominent fluid-filled small bowel loops within the lower abdomen, perhaps postsurgical ileus. These results will be called to the ordering clinician or representative by the Radiologist Assistant, and communication documented in the PACS or zVision Dashboard. Electronically Signed   By: Bary Richard M.D.   On: 02/14/2015 10:34       Thank you for allowing me to participate in the care of your patient. We will continue to follow.   Note: This  dictation was prepared with Dragon dictation along with smaller phrase technology. Any transcriptional errors that result from this process are unintentional.  Time spent: 45  Aleksa Collinsworth, MD

## 2015-02-14 NOTE — Progress Notes (Signed)
Pt still vomiting and unable to tolerate po pain meds. Order received for IM phenergan once if needed. Will attempt po pain meds again and administer Phenergan if pt still unable to tolerate it po.

## 2015-02-14 NOTE — Progress Notes (Signed)
Pt has had n/v x 2 this shift. Order for zofran odt received. MD notified of pt status.

## 2015-02-14 NOTE — Progress Notes (Signed)
Post Partum Day 3 Subjective:s/p repeat LTCS, c/o 24 hours of N/ V after eating yogart yesterday . Some upper abd pain  as above  Objective: Blood pressure 140/90, pulse 119, temperature 98.8 F (37.1 C), temperature source Oral, resp. rate 18, height 5\' 4"  (1.626 m), weight 219 lb (99.338 kg), last menstrual period 05/14/2014, SpO2 97 %, unknown if currently breastfeeding.  Physical Exam:  General: alert and cooperative  Abd : soft + TTP left upper quadrant , no rebound Lungs CTA CV RRR  Lochia: appropriate Uterine Fundus: firm Incision: healing well DVT Evaluation: No evidence of DVT seen on physical exam.   Recent Labs  02/12/15 0634  HGB 8.5*  HCT 25.3*    Assessment/Plan: New onset N/V. Upper abd tenderness possible gallbladder ds.  Upper abd U/S, Amylase / lipase and CMP + CBC  Start IV D5 LR + KCL  Add IV Zofran    LOS: 3 days   SCHERMERHORN,THOMAS 02/14/2015, 9:19 AM

## 2015-02-14 NOTE — Progress Notes (Signed)
Report to Dr Feliberto GottronSchermerhorn re: US. Advised to get a Chest PA&lat and a Hospitalist consult.

## 2015-02-15 ENCOUNTER — Inpatient Hospital Stay (HOSPITAL_COMMUNITY)
Admit: 2015-02-15 | Discharge: 2015-02-15 | Disposition: A | Discharge status: Home / Self Care | Payer: 59 | Attending: Obstetrics and Gynecology | Admitting: Obstetrics and Gynecology

## 2015-02-15 ENCOUNTER — Inpatient Hospital Stay: Payer: 59

## 2015-02-15 DIAGNOSIS — I313 Pericardial effusion (noninflammatory): Secondary | ICD-10-CM

## 2015-02-15 LAB — BLOOD GAS, ARTERIAL
Acid-base deficit: 0.7 mmol/L (ref 0.0–2.0)
Allens test (pass/fail): POSITIVE — AB
Bicarbonate: 21 mEq/L (ref 21.0–28.0)
FIO2: 0.21
O2 Saturation: 99.4 %
PCO2 ART: 24 mmHg — AB (ref 32.0–48.0)
PH ART: 7.55 — AB (ref 7.350–7.450)
Patient temperature: 37
pO2, Arterial: 137 mmHg — ABNORMAL HIGH (ref 83.0–108.0)

## 2015-02-15 LAB — CBC
HEMATOCRIT: 25.9 % — AB (ref 35.0–47.0)
HEMOGLOBIN: 8.8 g/dL — AB (ref 12.0–16.0)
MCH: 30.7 pg (ref 26.0–34.0)
MCHC: 34.1 g/dL (ref 32.0–36.0)
MCV: 90.1 fL (ref 80.0–100.0)
Platelets: 194 10*3/uL (ref 150–440)
RBC: 2.88 MIL/uL — AB (ref 3.80–5.20)
RDW: 14.2 % (ref 11.5–14.5)
WBC: 3.8 10*3/uL (ref 3.6–11.0)

## 2015-02-15 LAB — BASIC METABOLIC PANEL
Anion gap: 7 (ref 5–15)
BUN: 29 mg/dL — ABNORMAL HIGH (ref 6–20)
CHLORIDE: 108 mmol/L (ref 101–111)
CO2: 24 mmol/L (ref 22–32)
Calcium: 7.2 mg/dL — ABNORMAL LOW (ref 8.9–10.3)
Creatinine, Ser: 0.86 mg/dL (ref 0.44–1.00)
GFR calc non Af Amer: >60 mL/min (ref >60)
Glucose, Bld: 97 mg/dL (ref 65–99)
POTASSIUM: 3.7 mmol/L (ref 3.5–5.1)
SODIUM: 139 mmol/L (ref 135–145)

## 2015-02-15 LAB — MAGNESIUM: Magnesium: 1.8 mg/dL (ref 1.7–2.4)

## 2015-02-15 LAB — TSH: TSH: 2.29 u[IU]/mL (ref 0.350–4.500)

## 2015-02-15 MED ORDER — MEPERIDINE HCL 25 MG/ML IJ SOLN: 50.0000 mg | INTRAMUSCULAR | Status: DC | PRN

## 2015-02-15 MED ORDER — PROMETHAZINE HCL 25 MG/ML IJ SOLN: 12.5000 mg | INTRAMUSCULAR | Status: DC | PRN

## 2015-02-15 MED ORDER — KETOROLAC TROMETHAMINE 30 MG/ML IJ SOLN
30.0000 mg | Freq: Three times a day (TID) | INTRAMUSCULAR | Status: DC | PRN
  Administered 2015-02-16 – 2015-02-17 (×2): 30 mg via INTRAVENOUS
  Filled 2015-02-15 (×2): qty 1

## 2015-02-15 MED ORDER — BENZOCAINE (TOPICAL) 20 % EX AERO
INHALATION_SPRAY | Freq: Four times a day (QID) | CUTANEOUS | Status: DC | PRN
  Filled 2015-02-15: qty 57

## 2015-02-15 MED ORDER — LACTATED RINGERS IV BOLUS (SEPSIS)
500.0000 mL | Freq: Once | INTRAVENOUS | Status: AC
  Administered 2015-02-15: 500 mL via INTRAVENOUS

## 2015-02-15 MED ORDER — BENZOCAINE 20 % MT SOLN: 1.0000 "application " | Freq: Four times a day (QID) | OROMUCOSAL | Status: DC | PRN

## 2015-02-15 MED ORDER — LACTATED RINGERS IV SOLN
INTRAVENOUS | Status: DC
  Administered 2015-02-15 – 2015-02-17 (×5): via INTRAVENOUS

## 2015-02-15 MED ORDER — ONDANSETRON HCL 4 MG/2ML IJ SOLN
4.0000 mg | INTRAMUSCULAR | Status: DC
  Administered 2015-02-15 – 2015-02-17 (×10): 4 mg via INTRAVENOUS
  Filled 2015-02-15 (×11): qty 2

## 2015-02-15 NOTE — Progress Notes (Signed)
[facility] [place] [facility]   PATIENT NAME: Brittany Oconnell    MR#:  [id number]  DATE OF BIRTH:  26-Mar-1985  SUBJECTIVE:  CHIEF COMPLAINT:  No chief complaint on file.  patient is 29 year old Caucasian female with past medical history significant for the History of C-section done on 02/14/2015 by Dr. Feliberto GottronSchermerhorn who is being seen for nausea, vomiting, and postoperative ileus. When the patient, she has been doing a little bit better today. She had a few bouts of nausea, vomiting yesterday, however, nothing overnight and nothing today and no nausea. Had a few bowel movements liquidy earlier today, feels less bloated and uncomfortable.   Review of Systems  Constitutional: Negative for fever, chills and weight loss.  HENT: Negative for congestion.   Eyes: Negative for blurred vision and double vision.  Respiratory: Negative for cough, sputum production, shortness of breath and wheezing.   Cardiovascular: Negative for chest pain, palpitations, orthopnea, leg swelling and PND.  Gastrointestinal: Positive for nausea, vomiting and abdominal pain. Negative for diarrhea, constipation and blood in stool.  Genitourinary: Negative for dysuria, urgency, frequency and hematuria.  Musculoskeletal: Negative for falls.  Neurological: Negative for dizziness, tremors, focal weakness and headaches.  Endo/Heme/Allergies: Does not bruise/bleed easily.  Psychiatric/Behavioral: Negative for depression. The patient does not have insomnia.     VITAL SIGNS: Blood pressure 127/77, pulse 122, temperature 98.7 F (37.1 C), temperature source Oral, resp. rate 20, height 5\' 4"  (1.626 m), weight 99.338 kg (219 lb), last menstrual period 05/14/2014, SpO2 98 %, unknown if currently breastfeeding.  PHYSICAL EXAMINATION:   GENERAL:  29 y.o.-year-old patient lying in the bed with no acute distress.  EYES: Pupils equal, round, reactive to light and accommodation. No scleral icterus. Extraocular  muscles intact.  HEENT: Head atraumatic, normocephalic. Oropharynx and nasopharynx clear.  NECK:  Supple, no jugular venous distention. No thyroid enlargement, no tenderness.  LUNGS: Normal breath sounds bilaterally, no wheezing, rales,rhonchi or crepitation. No use of accessory muscles of respiration.  CARDIOVASCULAR: S1, S2 normal. No murmurs, rubs, or gallops.  ABDOMEN: Soft, nontender, nondistended. Bowel sounds present. No organomegaly or mass.  EXTREMITIES: No pedal edema, cyanosis, or clubbing.  NEUROLOGIC: Cranial nerves II through XII are intact. Muscle strength 5/5 in all extremities. Sensation intact. Gait not checked.  PSYCHIATRIC: The patient is alert and oriented x 3.  SKIN: No obvious rash, lesion, or ulcer.   ORDERS/RESULTS REVIEWED:   CBC  Recent Labs Lab 02/11/15 0638 02/12/15 0634 02/14/15 1129 02/15/15 0645  WBC 7.3 11.6* 2.7* 3.8  HGB 10.9* 8.5* 10.9* 8.8*  HCT 32.6* 25.3* 32.9* 25.9*  PLT 114* 102* 239 194  MCV 91.1 90.6 90.1 90.1  MCH 30.3 30.3 30.0 30.7  MCHC 33.3 33.5 33.3 34.1  RDW 14.0 14.3 14.3 14.2   ------------------------------------------------------------------------------------------------------------------  Chemistries   Recent Labs Lab 02/14/15 1129 02/15/15 0645  NA 136 139  K 3.6 3.7  CL 104 108  CO2 25 24  GLUCOSE 125* 97  BUN 25* 29*  CREATININE 0.78 0.86  CALCIUM 7.5* 7.2*  MG  --  1.8  AST 52*  --   ALT 42  --   ALKPHOS 58  --   BILITOT 0.5  --    ------------------------------------------------------------------------------------------------------------------ estimated creatinine clearance is 111.5 mL/min (by C-G formula based on Cr of 0.86). ------------------------------------------------------------------------------------------------------------------ No results for input(s): TSH, T4TOTAL, T3FREE, THYROIDAB in the last 72 hours.  Invalid input(s): FREET3  Cardiac Enzymes No results for input(s): CKMB,  TROPONINI, MYOGLOBIN  in the last 168 hours.  Invalid input(s): CK ------------------------------------------------------------------------------------------------------------------ Invalid input(s): POCBNP ---------------------------------------------------------------------------------------------------------------  RADIOLOGY: Dg Chest 2 View  02/14/2015  CLINICAL DATA:  Recent C-section EXAM: CHEST - 2 VIEW COMPARISON:  None. FINDINGS: Cardiac shadow is within normal limits. The lungs are well aerated bilaterally. Minimal left upper lobe atelectasis is noted. Tiny pleural effusions are noted bilaterally in the posterior costophrenic angles. The upper abdomen demonstrates multiple dilated loops of small bowel with air-fluid levels likely related to a small-bowel ileus. Correlation with the physical exam is recommended. IMPRESSION: Minimal left upper lobe atelectasis. Tiny pleural effusions bilaterally. Changes suggestive of a small-bowel ileus. Clinical correlation is recommended. Electronically Signed   By: Alcide Clever M.D.   On: 02/14/2015 14:24   Dg Abd 1 View  02/15/2015  CLINICAL DATA:  Postpartum day 4 status post Cesarean delivery, with postoperative ileus. EXAM: ABDOMEN - 1 VIEW COMPARISON:  None. FINDINGS: Transverse skin staples are noted in the lower pelvis. There moderately dilated small bowel loops throughout the abdomen measuring up to 4.7 cm diameter. Paucity of gas in the colon. No evidence of pneumatosis or pneumoperitoneum. IMPRESSION: Moderate postoperative adynamic ileus of the small bowel. Electronically Signed   By: Delbert Phenix M.D.   On: 02/15/2015 09:07   US Abdomen Limited Ruq  02/14/2015  CLINICAL DATA:  Upper abdominal tenderness, nausea, vomiting. C-section 4 days ago. EXAM: US ABDOMEN LIMITED - RIGHT UPPER QUADRANT COMPARISON:  None. FINDINGS: Gallbladder: Gallbladder is mildly distended but otherwise unremarkable. No gallstones seen. No gallbladder wall thickening or  pericholecystic fluid. No sonographic Murphy's sign elicited, per the sonographer. Common bile duct: Diameter: Within normal limits at 4 mm. Liver: No focal lesion identified. Within normal limits in parenchymal echogenicity. Small amount of ascites noted throughout the abdomen. Pericardial fluid noted at the heart base. Pleural fluid noted at the right lung base. Mildly prominent fluid-filled small bowel loops noted within the lower abdomen, perhaps a post surgical ileus. IMPRESSION: 1. Gallbladder appears normal. No evidence of cholecystitis. No bile duct dilatation. 2. Liver appears normal. 3. Small amount of ascites. 4. Pericardial fluid noted at the heart base, of uncertain amount. Would consider correlation with ECHO. 5. Pleural effusion at the right lung base, also of uncertain amount. 6. Mildly prominent fluid-filled small bowel loops within the lower abdomen, perhaps postsurgical ileus. These results will be called to the ordering clinician or representative by the Radiologist Assistant, and communication documented in the PACS or zVision Dashboard. Electronically Signed   By: Bary Richard M.D.   On: 02/14/2015 10:34    EKG: No orders found for this or any previous visit.  ASSESSMENT AND PLAN:  Active Problems:   Previous cesarean delivery affecting pregnancy   Pre-op exam   Postoperative state 1. Postoperative ileus, patient's at the having bowel movements and she feels somewhat better. Initiate ice chips. Return back to nothing by mouth status and place NG tube if nausea, vomiting recurs, follow-up KUB in the morning,  2. Hypokalemia, resolved with therapy , magnesium level is physiologic 3. Anemia with rehydration , follow closely , no bleeding noted  4. Pericardial effusion on abdominal ultrasound , echocardiogram results are pending   5.  lung atelectasis, continue incentive spirometry 6.  Tachycardia, etiology remains unclear, getting TSH as well as echocardiogram, get ABGs on room  air .   Management plans discussed with the patient, family and they are in agreement.   DRUG ALLERGIES: No Known Allergies  CODE STATUS:   TOTAL  TIME TAKING CARE OF THIS PATIENT:40 minutes    discussed with patient's husband, as well as nursing staff   Rickie Gange M.D on 02/15/2015 at 12:51 PM  Between 7am to 6pm - Pager - [phone]  After 6pm go to [email or web address] - password [email or web address] [facility]  [place] [facility]  Office  [phone]  CC: Primary care physician; No PCP Per Patient

## 2015-02-15 NOTE — Progress Notes (Signed)
*  PRELIMINARY RESULTS* Echocardiogram 2D Echocardiogram has been performed.  Brittany Oconnell 02/15/2015, 11:38 AM

## 2015-02-15 NOTE — Progress Notes (Signed)
Subjective: Postpartum Day 4: Cesarean Delivery complicated by a post op ileus . Pericardial effusion seen on upper abd u/s yesterday . Dr Juliene PinaMody from hospitalist service saw the pt ( appreciate the consult) .  Patient reports Pt with 5-6 'bouts of emesis yesterday ( last at 2100 ) .  Feels ok . Still no flatus , no bm  No sob  Objective: Vital signs in last 24 hours: Temp:  [98.9 F (37.2 C)-100.2 F (37.9 C)] 99.8 F (37.7 C) (11/25 0742) Pulse Rate:  [115-147] 115 (11/25 0742) Resp:  [18-24] 20 (11/25 0742) BP: (91-139)/(47-85) 126/63 mmHg (11/25 0742) SpO2:  [97 %-99 %] 99 % (11/25 0742)  Physical Exam:  General: alert and cooperative  [place] CTA without rales  CV RRR with murmur ABD: slight distention, approriate TTP , No rebound , minimal BS  Lochia: appropriate Uterine Fundus: firm Incision: healing well DVT Evaluation: No evidence of DVT seen on physical exam.   Recent Labs  02/14/15 1129 02/15/15 0645  HGB 10.9* 8.8*  HCT 32.9* 25.9*  K+ this am 3.7 BUN/CR 25/0.7 Assessment/Plan: Status post Cesarean section. Doing well postoperatively. 1. Post op Ileus  . If emesis continues I will place a NGT . and replace fluid out with 1:1 cc 2. pericardial effusion . Cardiac echo today to further evaluate. > Mild Tachycardai probably related to dehydration given emesis and BUN/CR ratio . Less likely due to pericardial effusion . Fluid bolus 500 andf increase IVF to 125 cc hr. 3Atelectasis . Lung exam is clear today and she continues to use IS.   Continue current care.  SCHERMERHORN,THOMAS 02/15/2015, 10:05 AM

## 2015-02-15 NOTE — Progress Notes (Signed)
NG tube inserted per MD orders. Patient tolerated well.

## 2015-02-16 ENCOUNTER — Inpatient Hospital Stay: Payer: 59

## 2015-02-16 LAB — CBC WITH DIFFERENTIAL/PLATELET
BASOS PCT: 0 %
Basophils Absolute: 0 10*3/uL (ref 0–0.1)
EOS ABS: 0.3 10*3/uL (ref 0–0.7)
EOS PCT: 6 %
HCT: 25.7 % — ABNORMAL LOW (ref 35.0–47.0)
HEMOGLOBIN: 8.6 g/dL — AB (ref 12.0–16.0)
LYMPHS ABS: 1 10*3/uL (ref 1.0–3.6)
Lymphocytes Relative: 16 %
MCH: 30 pg (ref 26.0–34.0)
MCHC: 33.3 g/dL (ref 32.0–36.0)
MCV: 90 fL (ref 80.0–100.0)
Monocytes Absolute: 0.6 10*3/uL (ref 0.2–0.9)
Monocytes Relative: 10 %
NEUTROS PCT: 68 %
Neutro Abs: 4.2 10*3/uL (ref 1.4–6.5)
PLATELETS: 233 10*3/uL (ref 150–440)
RBC: 2.85 MIL/uL — AB (ref 3.80–5.20)
RDW: 13.7 % (ref 11.5–14.5)
WBC: 6.1 10*3/uL (ref 3.6–11.0)

## 2015-02-16 LAB — BASIC METABOLIC PANEL
Anion gap: 8 (ref 5–15)
BUN: 20 mg/dL (ref 6–20)
CO2: 22 mmol/L (ref 22–32)
CREATININE: 0.75 mg/dL (ref 0.44–1.00)
Calcium: 7.7 mg/dL — ABNORMAL LOW (ref 8.9–10.3)
Chloride: 110 mmol/L (ref 101–111)
Glucose, Bld: 72 mg/dL (ref 65–99)
POTASSIUM: 3.1 mmol/L — AB (ref 3.5–5.1)
SODIUM: 140 mmol/L (ref 135–145)

## 2015-02-16 MED ORDER — POTASSIUM CHLORIDE IN NACL 20-0.9 MEQ/L-% IV SOLN
Freq: Once | INTRAVENOUS | Status: AC
  Administered 2015-02-16: via INTRAVENOUS
  Filled 2015-02-16: qty 1000

## 2015-02-16 MED ORDER — POTASSIUM CHLORIDE 10 MEQ/100ML IV SOLN
10.0000 meq | INTRAVENOUS | Status: AC
  Administered 2015-02-16 (×3): 10 meq via INTRAVENOUS
  Filled 2015-02-16 (×4): qty 100

## 2015-02-16 MED ORDER — METOCLOPRAMIDE HCL 10 MG PO TABS: 5.0000 mg | ORAL_TABLET | Freq: Three times a day (TID) | ORAL | Status: DC | PRN

## 2015-02-16 NOTE — Progress Notes (Signed)
Subjective: Postpartum Day 5: Cesarean Delivery. Hospital course complicated by a post op ileus + pericardial effusion . NGT placement last pm with 2300 cc output . No output since midnight and multiple bowel movt recently  Cardiac Echo : wnl yesterday    Objective: Vital signs in last 24 hours: Temp:  [98.7 F (37.1 C)-99.2 F (37.3 C)] 99.2 F (37.3 C) (11/26 0757) Pulse Rate:  [100-122] 100 (11/26 0757) Resp:  [18-20] 18 (11/26 0757) BP: (120-133)/(63-80) 129/63 mmHg (11/26 0757) SpO2:  [97 %-99 %] 99 % (11/26 0757)  Physical Exam:  General: alert and cooperative   CV RRR  Lungs  CTA  ABD : + BS  Slight distention . Appropriate TTP  Lochia: appropriate Uterine Fundus: firm Incision: healing well DVT Evaluation: No evidence of DVT seen on physical exam.   Recent Labs  02/15/15 0645 02/16/15 0600  HGB 8.8* 8.6*  HCT 25.9* 25.7*   K+ = 3.1  Assessment/Plan: Status post Cesarean section. Doing well postoperatively. postop ileus now resolving . WIll pull NGT and advance diet . Hypokalemia . Will replace with 2 runs of iv KCL . Anticipate d/c tomorrow .   Continue current care.  SCHERMERHORN,THOMAS 02/16/2015, 9:16 AM

## 2015-02-16 NOTE — Progress Notes (Signed)
Noticed rash on patients buttocks, small red and slightly raised. MD is aware. Barrier cream and donut applied to patients buttocks.

## 2015-02-16 NOTE — Progress Notes (Signed)
[facility] [place] [facility]   PATIENT NAME: Brittany Oconnell    MR#:  [id number]  DATE OF BIRTH:  09/02/1985  SUBJECTIVE:  CHIEF COMPLAINT:  No chief complaint on file.  patient is 29 year old Caucasian female with past medical history significant for the History of C-section done on 02/14/2015 by Dr. Feliberto Gottron who is being seen for nausea, vomiting, and postoperative ileus. Was initiated tonight. She was yesterday, however, started vomiting at around 9 PM yesterday and had NG tube placed, suctioning of approximately 800 cc. Today. She feels much more comfortable and NG tube has been pulled. Has been having bowel movements. Denies any abdominal pain or bloating. Now on clear liquid diet. .   Review of Systems  Constitutional: Negative for fever, chills and weight loss.  HENT: Negative for congestion.   Eyes: Negative for blurred vision and double vision.  Respiratory: Negative for cough, sputum production, shortness of breath and wheezing.   Cardiovascular: Negative for chest pain, palpitations, orthopnea, leg swelling and PND.  Gastrointestinal: Positive for nausea, vomiting and abdominal pain. Negative for diarrhea, constipation and blood in stool.  Genitourinary: Negative for dysuria, urgency, frequency and hematuria.  Musculoskeletal: Negative for falls.  Neurological: Negative for dizziness, tremors, focal weakness and headaches.  Endo/Heme/Allergies: Does not bruise/bleed easily.  Psychiatric/Behavioral: Negative for depression. The patient does not have insomnia.     VITAL SIGNS: Blood pressure 129/71, pulse 102, temperature 99.3 F (37.4 C), temperature source Oral, resp. rate 20, height  (1.626 m), weight 99.338 kg (219 lb), last menstrual period 05/14/2014, SpO2 99 %, unknown if currently breastfeeding.  PHYSICAL EXAMINATION:   GENERAL:  29 y.o.-year-old patient lying in the bed with no acute distress.  EYES: Pupils equal, round, reactive to  light and accommodation. No scleral icterus. Extraocular muscles intact.  HEENT: Head atraumatic, normocephalic. Oropharynx and nasopharynx clear.  NECK:  Supple, no jugular venous distention. No thyroid enlargement, no tenderness.  LUNGS: Normal breath sounds bilaterally, no wheezing, rales,rhonchi or crepitation. No use of accessory muscles of respiration.  CARDIOVASCULAR: S1, S2 normal. No murmurs, rubs, or gallops.  ABDOMEN: Soft, nontender, nondistended. Bowel sounds present, active. No organomegaly or mass.  EXTREMITIES: No pedal edema, cyanosis, or clubbing.  NEUROLOGIC: Cranial nerves II through XII are intact. Muscle strength 5/5 in all extremities. Sensation intact. Gait not checked.  PSYCHIATRIC: The patient is alert and oriented x 3.  SKIN: No obvious rash, lesion, or ulcer.   ORDERS/RESULTS REVIEWED:   CBC  Recent Labs Lab 02/11/15 0638 02/12/15 0634 02/14/15 1129 02/15/15 0645 02/16/15 0600  WBC 7.3 11.6* 2.7* 3.8 6.1  HGB 10.9* 8.5* 10.9* 8.8* 8.6*  HCT 32.6* 25.3* 32.9* 25.9* 25.7*  PLT 114* 102* 239 194 233  MCV 91.1 90.6 90.1 90.1 90.0  MCH 30.3 30.3 30.0 30.7 30.0  MCHC 33.3 33.5 33.3 34.1 33.3  RDW 14.0 14.3 14.3 14.2 13.7  LYMPHSABS  --   --   --   --  1.0  MONOABS  --   --   --   --  0.6  EOSABS  --   --   --   --  0.3  BASOSABS  --   --   --   --  0.0   ------------------------------------------------------------------------------------------------------------------  Chemistries   Recent Labs Lab 02/14/15 1129 02/15/15 0645 02/16/15 0600  NA 136 139 140  K 3.6 3.7 3.1*  CL 104 108 110  CO2 GLUCOSE 125* 97 72  BUN 25* 29* 20  CREATININE 0.78 0.86 0.75  CALCIUM 7.5* 7.2* 7.7*  MG  --  1.8  --   AST 52*  --   --   ALT 42  --   --   ALKPHOS 58  --   --   BILITOT 0.5  --   --    ------------------------------------------------------------------------------------------------------------------ estimated creatinine clearance is 119.8  mL/min (by C-G formula based on Cr of 0.75). ------------------------------------------------------------------------------------------------------------------  Recent Labs  02/15/15 1313  TSH 2.290    Cardiac Enzymes No results for input(s): CKMB, TROPONINI, MYOGLOBIN in the last 168 hours.  Invalid input(s): CK ------------------------------------------------------------------------------------------------------------------ Invalid input(s): POCBNP ---------------------------------------------------------------------------------------------------------------  RADIOLOGY: Dg Chest 2 View  02/14/2015  CLINICAL DATA:  Recent C-section EXAM: CHEST - 2 VIEW COMPARISON:  None. FINDINGS: Cardiac shadow is within normal limits. The lungs are well aerated bilaterally. Minimal left upper lobe atelectasis is noted. Tiny pleural effusions are noted bilaterally in the posterior costophrenic angles. The upper abdomen demonstrates multiple dilated loops of small bowel with air-fluid levels likely related to a small-bowel ileus. Correlation with the physical exam is recommended. IMPRESSION: Minimal left upper lobe atelectasis. Tiny pleural effusions bilaterally. Changes suggestive of a small-bowel ileus. Clinical correlation is recommended. Electronically Signed   By: Alcide CleverMark  Lukens M.D.   On: 02/14/2015 14:24   Dg Abd 1 View  02/15/2015  CLINICAL DATA:  Postpartum day 4 status post Cesarean delivery, with postoperative ileus. EXAM: ABDOMEN - 1 VIEW COMPARISON:  None. FINDINGS: Transverse skin staples are noted in the lower pelvis. There moderately dilated small bowel loops throughout the abdomen measuring up to 4.7 cm diameter. Paucity of gas in the colon. No evidence of pneumatosis or pneumoperitoneum. IMPRESSION: Moderate postoperative adynamic ileus of the small bowel. Electronically Signed   By: Delbert PhenixJason A Poff M.D.   On: 02/15/2015 09:07    EKG: No orders found for this or any previous  visit.  ASSESSMENT AND PLAN:  Active Problems:   Previous cesarean delivery affecting pregnancy   Pre-op exam   Postoperative state 1. Postoperative ileus, status post NG tube placement yesterday was approximately 800 cc of suctioning after 1.5 L of emesis, no NG tube has been pulled, patient is having bowel movements and she feels better. Bowel sounds are better.  Initiated on clear liquid diet by Dr. Feliberto GottronSchermerhorn. Repeat KUB today, continue IV fluids  2. Hypokalemia, potassium supplements are being given intravenously, magnesium level was physiologic yesterday 3. Anemia with rehydration , stable , no bleeding noted  4. Pericardial effusion on abdominal ultrasound , echocardiogram results are normal, except moderate tricuspid regurgitation  5.  lung atelectasis, continue incentive spirometry 6.  Tachycardia, etiology remains unclear, TSH was normal , as well as echocardiogram, ABGs on room air were unremarkable as well.   Management plans discussed with the patient, family and they are in agreement.   DRUG ALLERGIES: No Known Allergies  CODE STATUS:   TOTAL TIME TAKING CARE OF THIS PATIENT: 30 minutes      Terasa Orsini M.D on 02/16/2015 at 11:45 AM  Between 7am to 6pm - Pager - [phone]  After 6pm go to [email or web address] - password [email or web address] [facility]  [facility]  Office  [phone]  CC: Primary care physician; No PCP Per Patient

## 2015-02-16 NOTE — Progress Notes (Signed)
Called Dr. Anne HahnWillis to clarify order entered by Dr. Winona LegatoVaickute to change diet from advance diet as tolerated to NPO. Pt has tolerated clear liquid diet today with no N/V. Pt able to pass flatus and have BM. Please see orders.

## 2015-02-17 ENCOUNTER — Inpatient Hospital Stay: Payer: 59

## 2015-02-17 LAB — BASIC METABOLIC PANEL
ANION GAP: 8 (ref 5–15)
BUN: 18 mg/dL (ref 6–20)
CO2: 21 mmol/L — ABNORMAL LOW (ref 22–32)
Calcium: 7.9 mg/dL — ABNORMAL LOW (ref 8.9–10.3)
Chloride: 110 mmol/L (ref 101–111)
Creatinine, Ser: 0.63 mg/dL (ref 0.44–1.00)
Glucose, Bld: 85 mg/dL (ref 65–99)
POTASSIUM: 3.3 mmol/L — AB (ref 3.5–5.1)
SODIUM: 139 mmol/L (ref 135–145)

## 2015-02-17 MED ORDER — KCL IN DEXTROSE-NACL 20-5-0.45 MEQ/L-%-% IV SOLN
INTRAVENOUS | Status: DC
  Administered 2015-02-17: 09:00:00 via INTRAVENOUS
  Filled 2015-02-17 (×3): qty 1000

## 2015-02-17 MED ORDER — HYDROCODONE-ACETAMINOPHEN 5-325 MG PO TABS: 1.0000 | ORAL_TABLET | Freq: Four times a day (QID) | ORAL | Status: AC | PRN

## 2015-02-17 MED ORDER — IBUPROFEN 600 MG PO TABS: 600.0000 mg | ORAL_TABLET | Freq: Four times a day (QID) | ORAL | Status: AC

## 2015-02-17 NOTE — Progress Notes (Signed)
On-q pump no longer in place. Site oozing yellow serous drainage. No redness or swelling noted at insertion site. Dressing removed and ABD pad enforced with Tegaderm placed. Will continue to assess

## 2015-02-17 NOTE — Progress Notes (Signed)
On -Q site still leaking a yellowish drainage. RN changed bandage and sent patient home with extra. No redness, swelling, or odor.

## 2015-02-17 NOTE — Discharge Instructions (Signed)
Patient understands all discharge instructions and the need to make follow up appointments. Patient discharge via wheelchair with auxillary. 

## 2015-02-17 NOTE — Discharge Summary (Signed)
Obstetric Discharge Summary Reason for Admission: cesarean section Prenatal Procedures: none Intrapartum Procedures: cesarean: low cervical, transverse, tubal ligation and on Q placement Postpartum Procedures: upper abd U/S, cardiac echo ,  ABG , cxr . Abd xray  NGT Complications-Operative and Postpartum: post op ileus , pericardial effusion with normal echo  except TC regurg .    POD#3 pt developed N/V . Upper Abd u/s revealed pericardial effusion and small pleural effusion and normal gallbladder . Hospitalist service consulted . Diagnosed with an Ileus .Mild tachycardia  With respiratory alkalosis noted .   NGT placed day 4-5 and removed . Copious bm ensued . Hypokalemia 3.1 which required IV K+ replacement . Day of d/c tolerating clear liquids . No N/V .   HEMOGLOBIN  Date Value Ref Range Status  02/16/2015 8.6* 12.0 - 16.0 g/dL Final   HCT  Date Value Ref Range Status  02/16/2015 25.7* 35.0 - 47.0 % Final  K+= 3.1 to 3.3 on DOD  Physical Exam:  General: alert and cooperative  Lungs CTA  CV RRR  Abd soft NT . + BS  Lochia: appropriate Uterine Fundus: firm Incision: healing well DVT Evaluation: No evidence of DVT seen on physical exam.  Discharge Diagnoses: post op Ceasrean Section + BTL . Post op ileus . pericardia effusion  with normal Echo   Discharge Information: Date: 02/17/2015 Activity: pelvic rest Diet: routine and soft and advance prn  Medications: Ibuprofen and Vicodin Condition: stable Instructions: refer to practice specific booklet Discharge to: home Follow-up Information    Follow up with Melvin Marmo, MD In 10 weeks.   Specialty:  Obstetrics and Gynecology   Why:  For wound re-check   Contact information:   [address] [facility] [place] [id number] [phone]       Newborn Data: Live born female  Birth Weight: 7 lb 11.5 oz (3500 g) APGAR: 6, 9  Home with mother.  Ingvald Theisen 02/17/2015, 9:27 AM

## 2015-03-19 NOTE — Addendum Note (Signed)
Addendum  created 03/19/15 0925 by Lenard SimmerAndrew Dameisha Tschida, MD   Modules edited: Anesthesia Responsible Staff

## 2016-12-22 IMAGING — CR DG ABDOMEN 1V
1 series · 2 of 2 positions shown · non-contrast
Comparison: None.

CLINICAL DATA: Postpartum day 4 status post Cesarean delivery, with
postoperative ileus.

EXAM:
ABDOMEN - 1 VIEW

[Series 1: dg abd 1 view · 0.14mm/px · 2 of 2 slices shown]
[im 1/2]
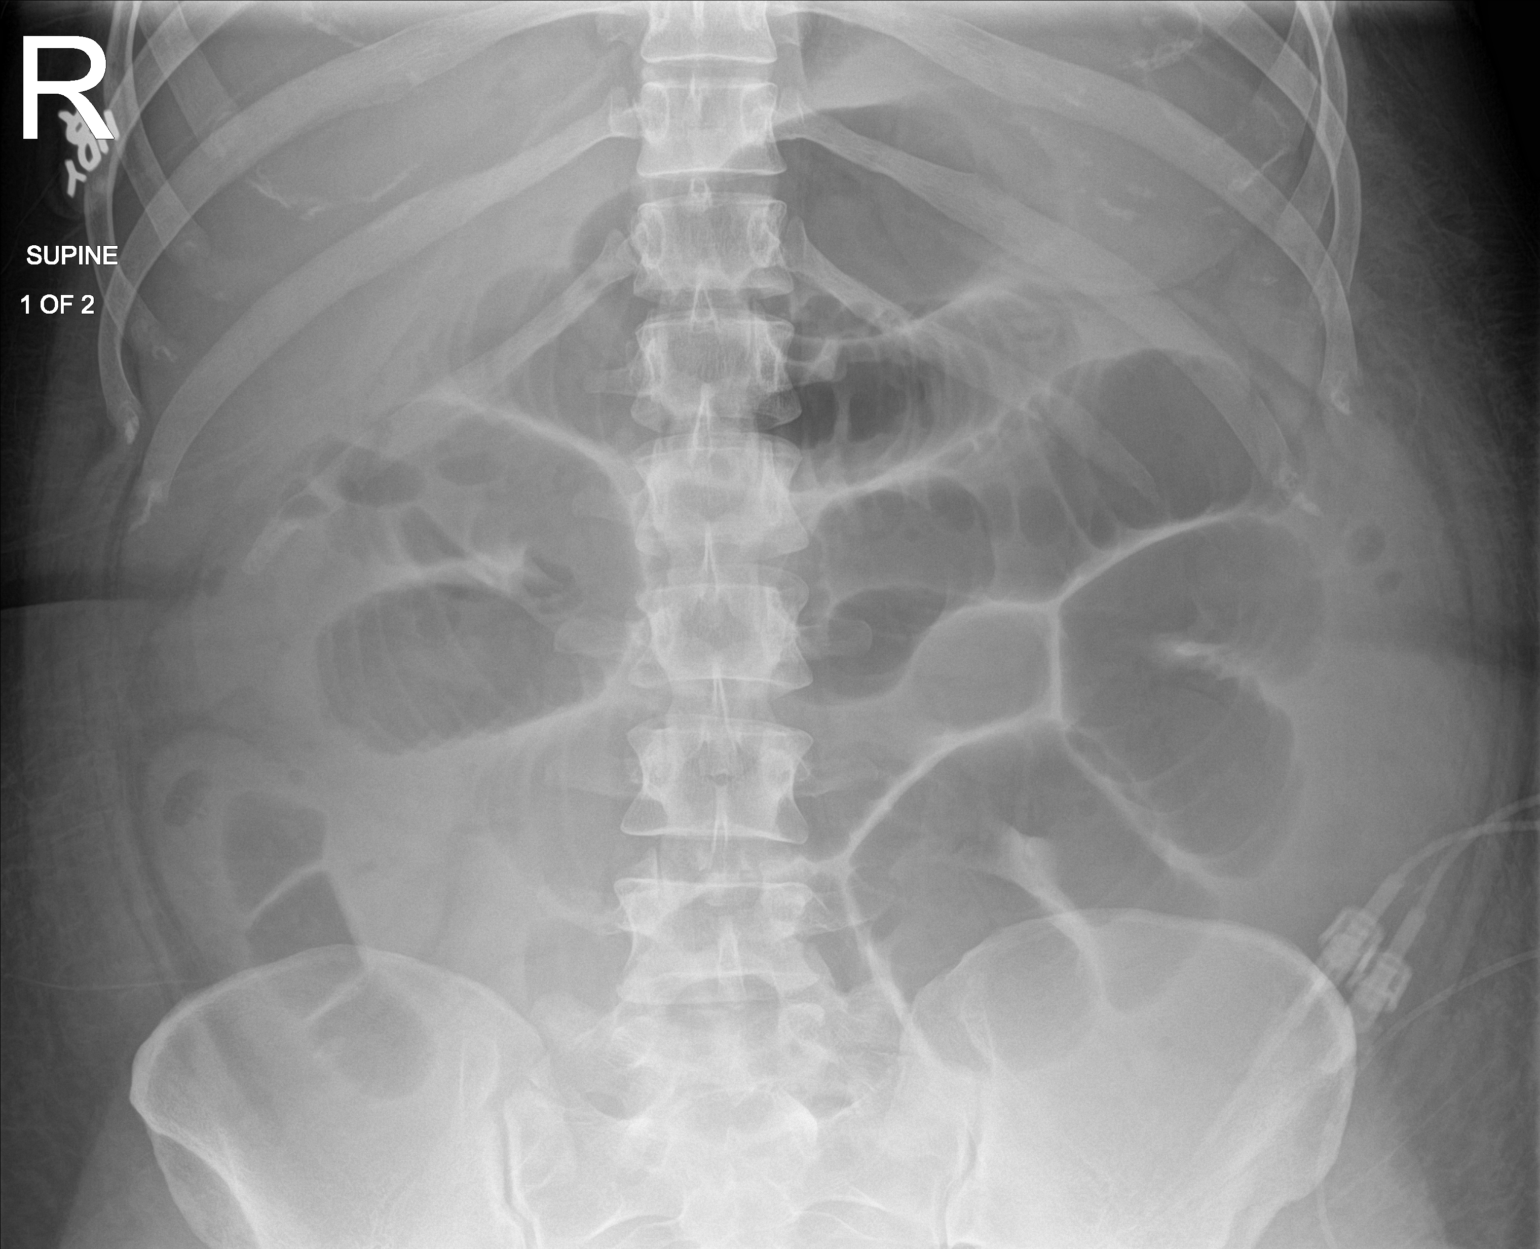
[im 2/2]
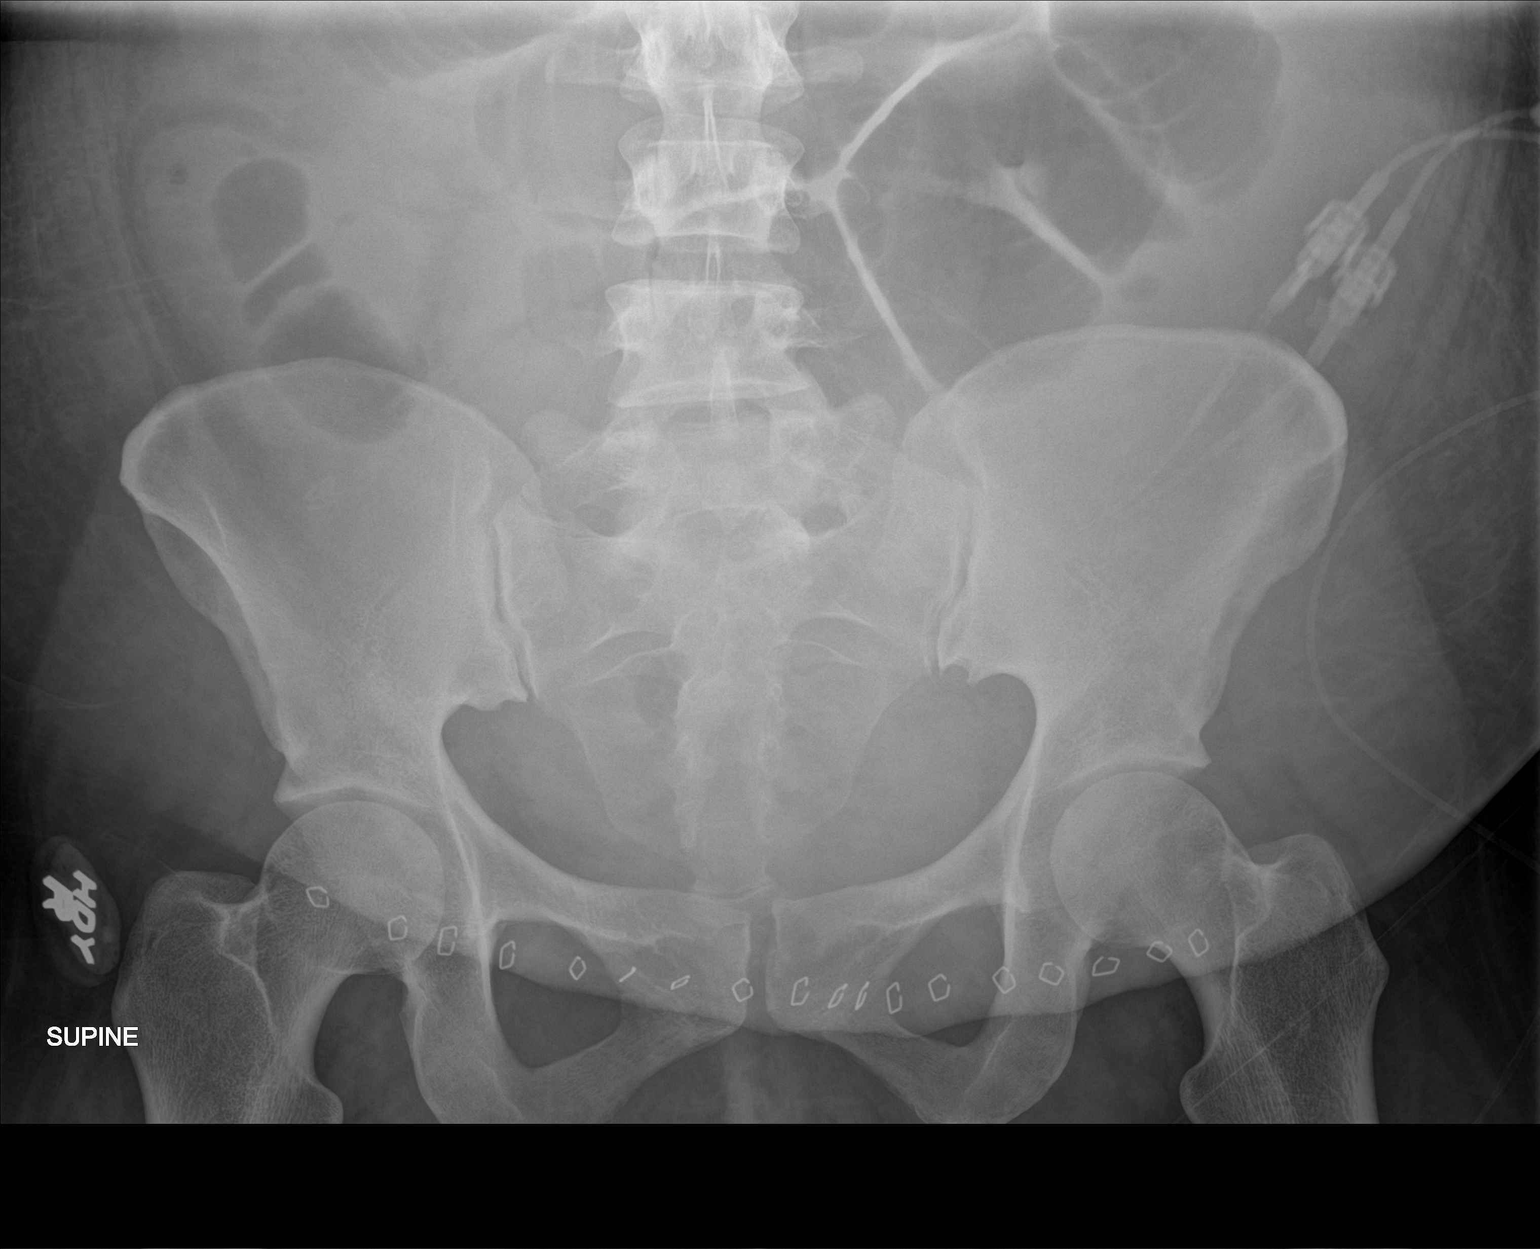

[2 of 2 positions shown; findings below may reference images not displayed]

FINDINGS: Transverse skin staples are noted in the lower pelvis. There
moderately dilated small bowel loops throughout the abdomen
measuring up to 4.7 cm diameter. Paucity of gas in the colon. No
evidence of pneumatosis or pneumoperitoneum.
IMPRESSION: Moderate postoperative adynamic ileus of the small bowel.

## 2016-12-23 IMAGING — CR DG ABDOMEN 1V
1 series · 2 of 2 positions shown · non-contrast
Comparison: 02/15/2015.

CLINICAL DATA: Recent C-section with ileus. Patient is having bowel
movements today.

EXAM:
ABDOMEN - 1 VIEW

[Series 1: dg abd 1 view · 0.14mm/px · 2 of 2 slices shown]
[im 1/2]
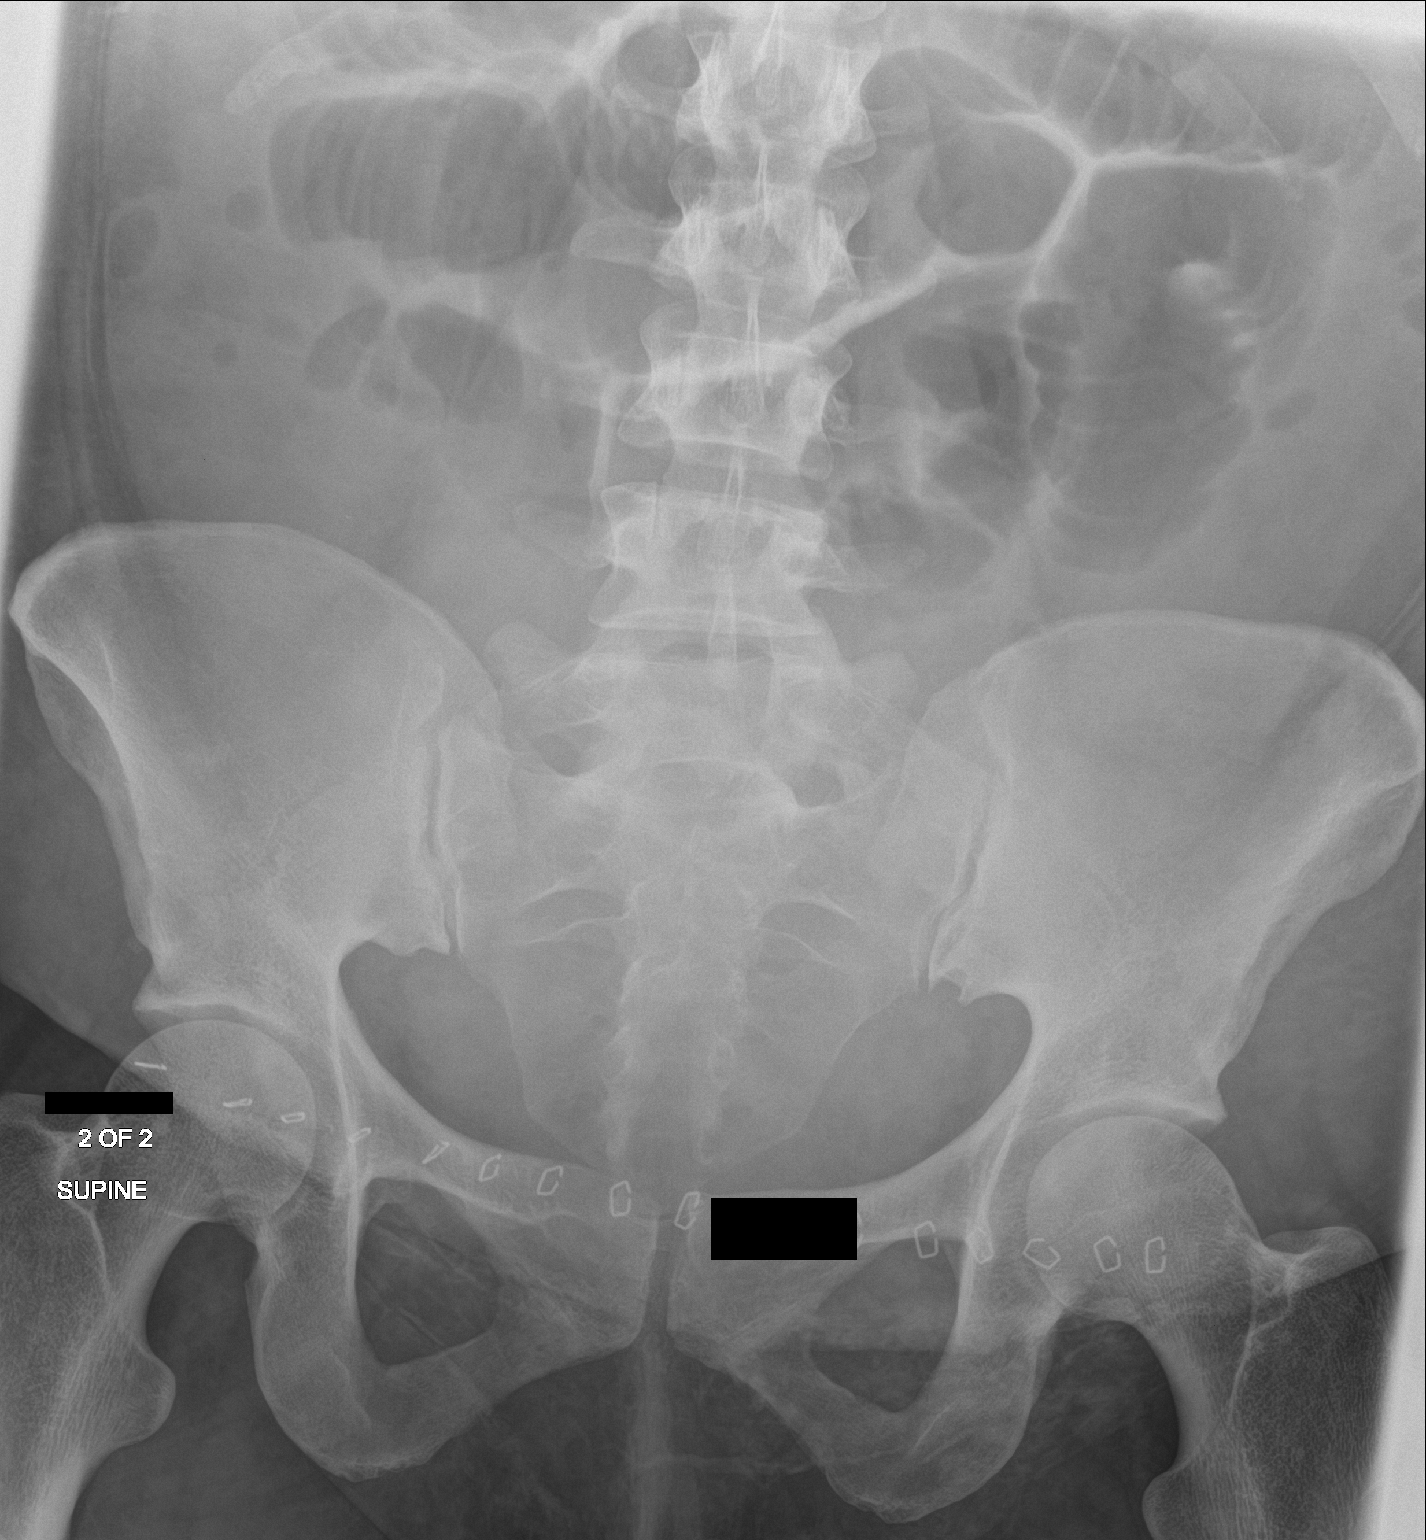
[im 2/2]
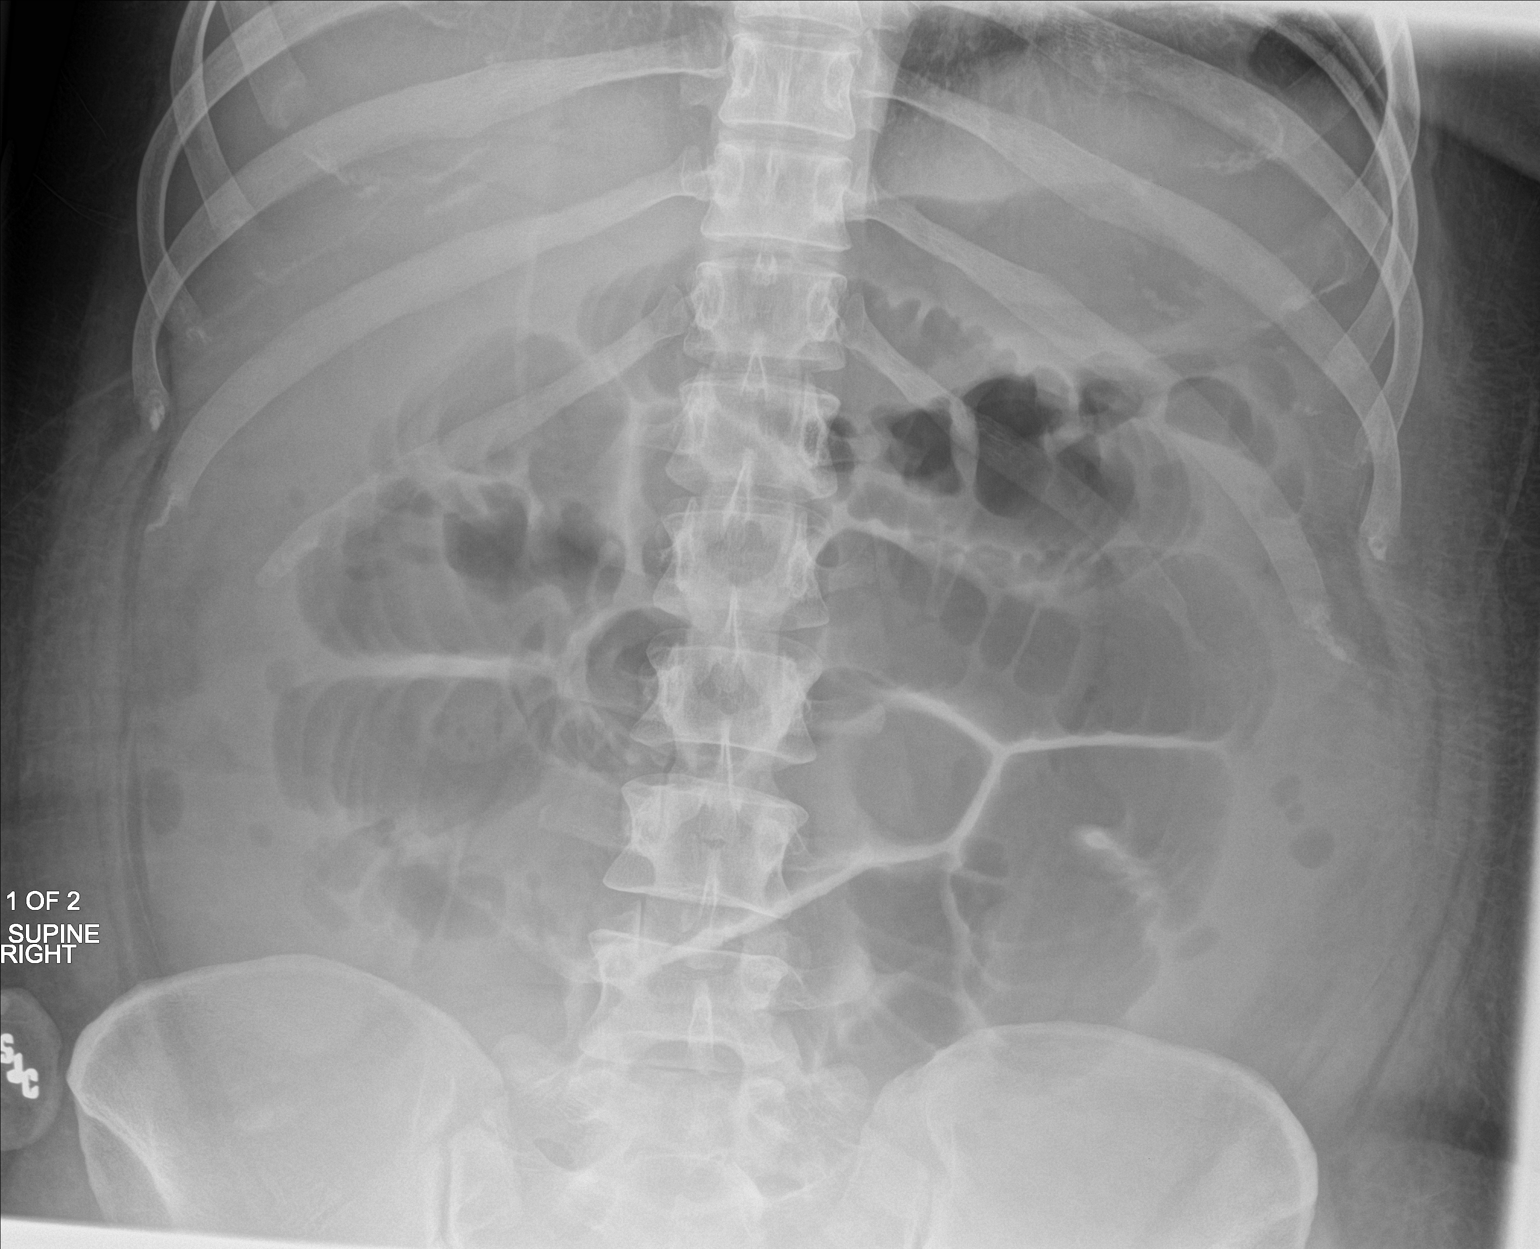

[2 of 2 positions shown; findings below may reference images not displayed]

FINDINGS: Again noted are surgical skin staples in the pelvis. There continues
to be dilated loops of small bowel in the abdomen. The degree of
small bowel distention has minimally changed. No significant gas
identified in the colon or rectum. Limited evaluation for free air
on the supine images.
IMPRESSION: Persistent dilated loops of small bowel in the abdomen. Findings
remain compatible with a small bowel ileus.

## 2016-12-24 IMAGING — CR DG ABDOMEN 1V
1 series · 2 of 2 positions shown · non-contrast
Comparison: 02/16/2015

CLINICAL DATA: Recent C-section, ileus

EXAM:
ABDOMEN - 1 VIEW

[Series 1: dg abd 1 view · 0.14mm/px · 2 of 2 slices shown]
[im 1/2]
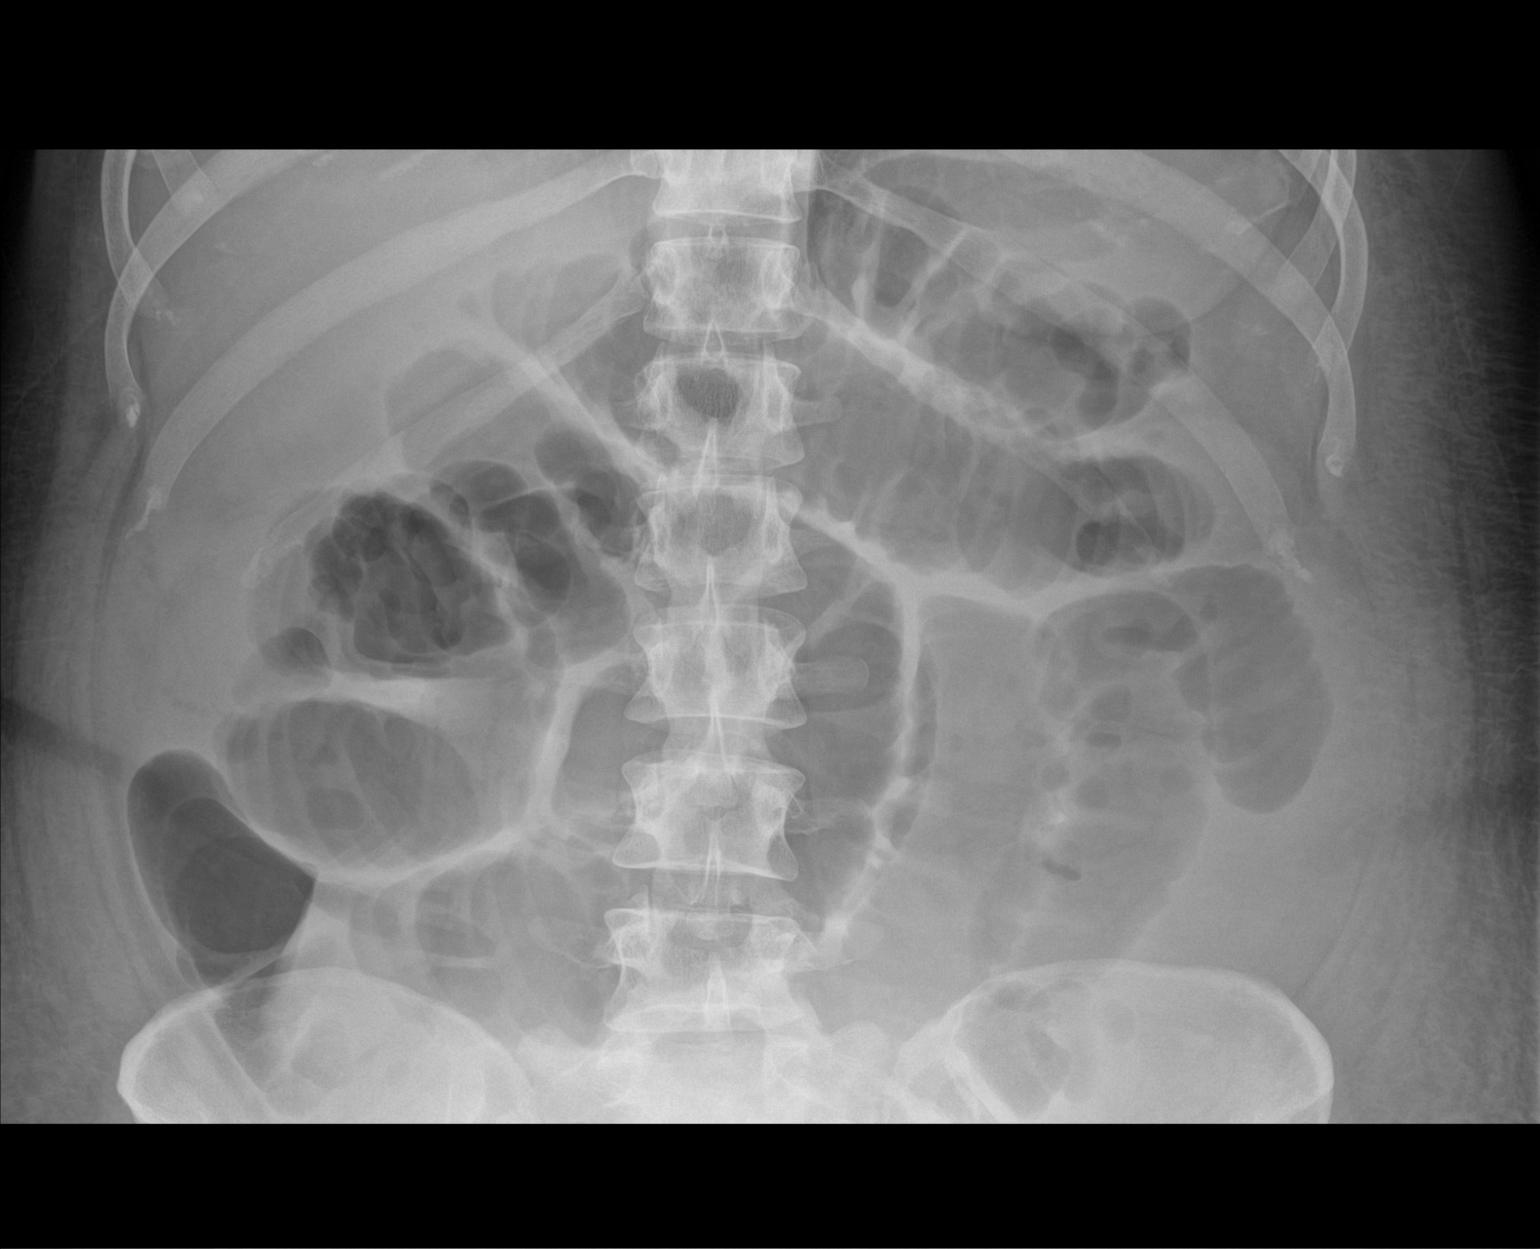
[im 2/2]
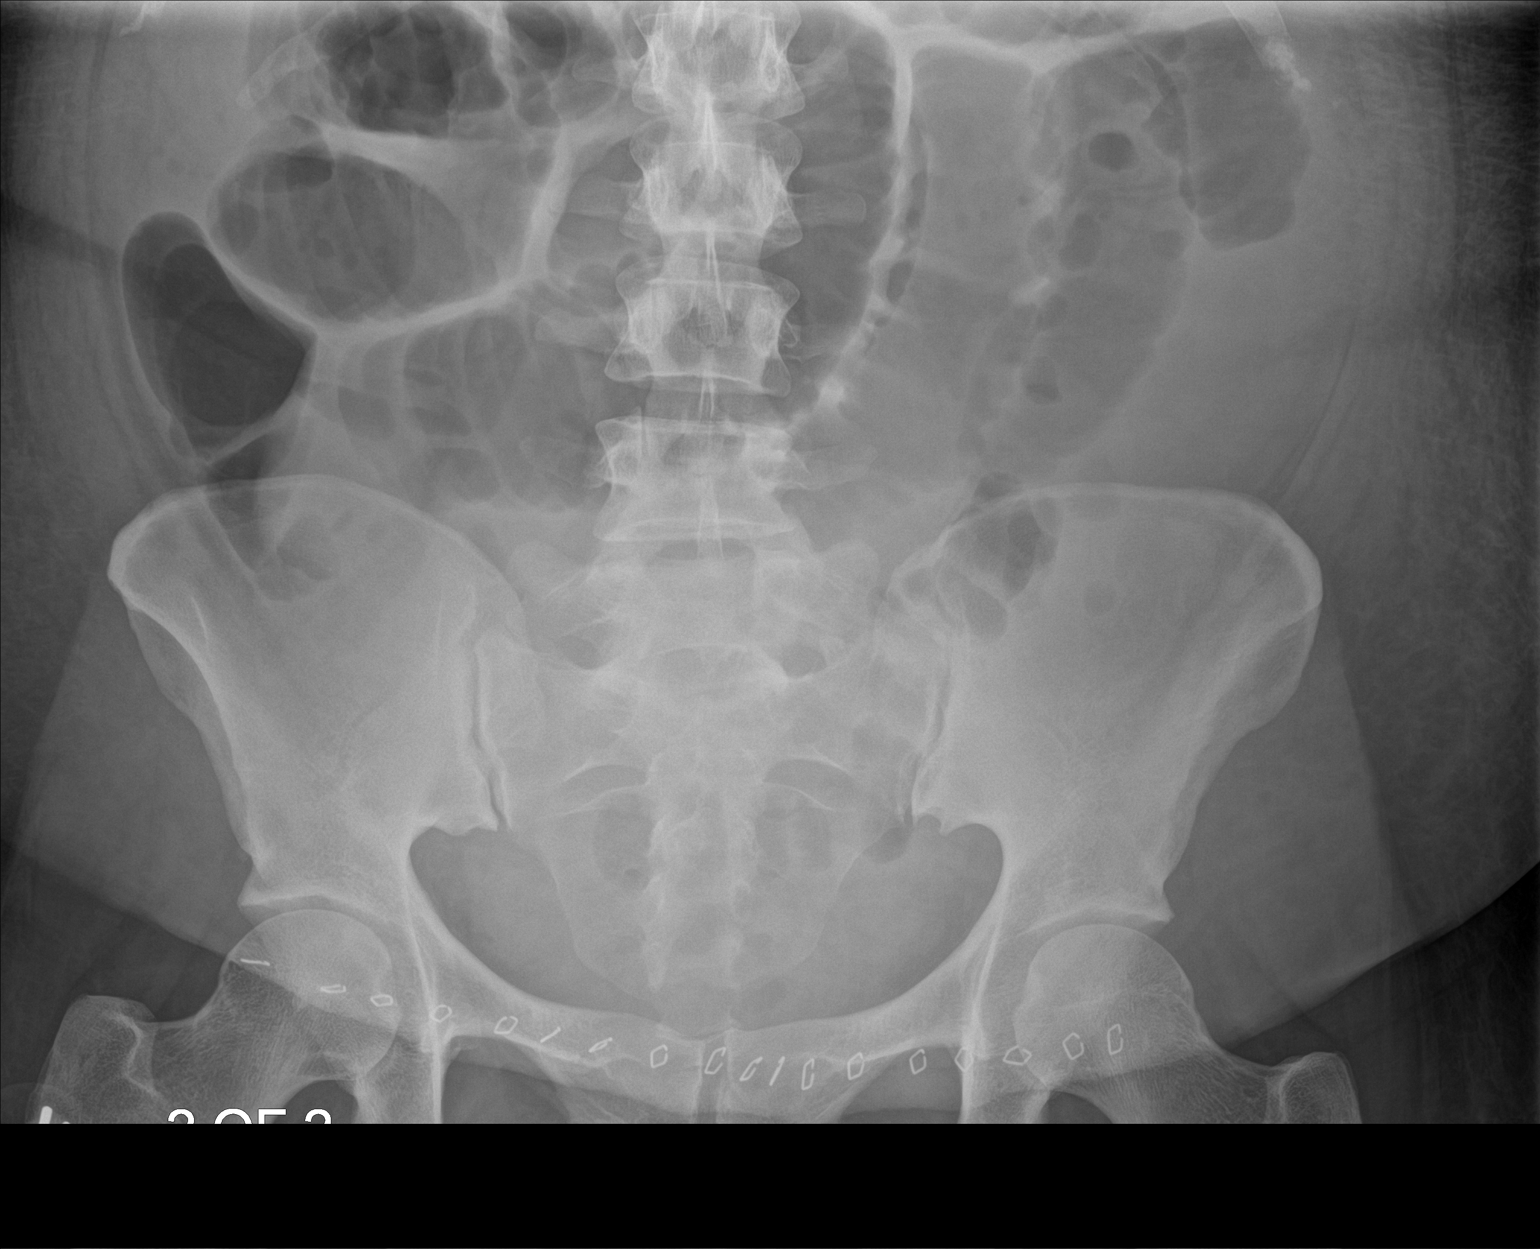

[2 of 2 positions shown; findings below may reference images not displayed]

FINDINGS: Persistent gaseous distended small bowel loops consistent with
ileus. No small bowel air-fluid levels. Skin staples are noted
suprapubic region.
IMPRESSION: Persistent gaseous distended small bowel loops consistent with
ileus.

## 2017-04-20 IMAGING — US US ABDOMEN LIMITED
1 series · 13 of 25 positions shown · non-contrast
Comparison: None.

CLINICAL DATA: Upper abdominal tenderness, nausea, vomiting.
C-section 4 days ago.

EXAM:
US ABDOMEN LIMITED - RIGHT UPPER QUADRANT

[Series 1: us abdomen limited · 0.26mm/px · 13 of 50 slices shown]
[im 1/50]
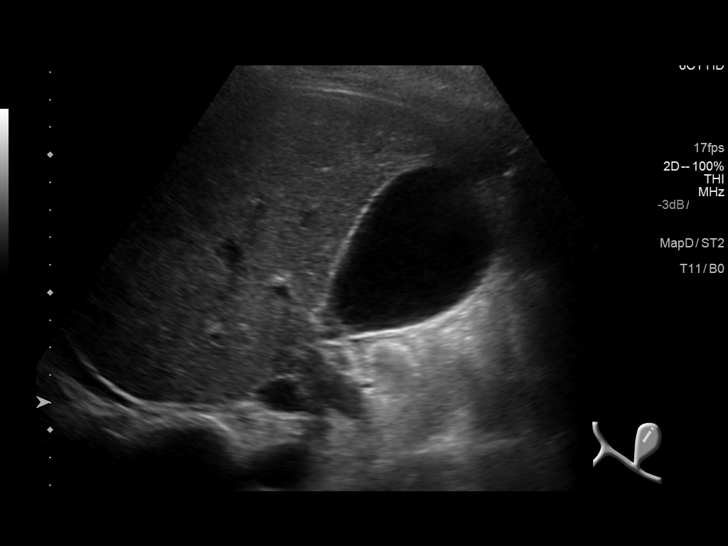
[im 5/50]
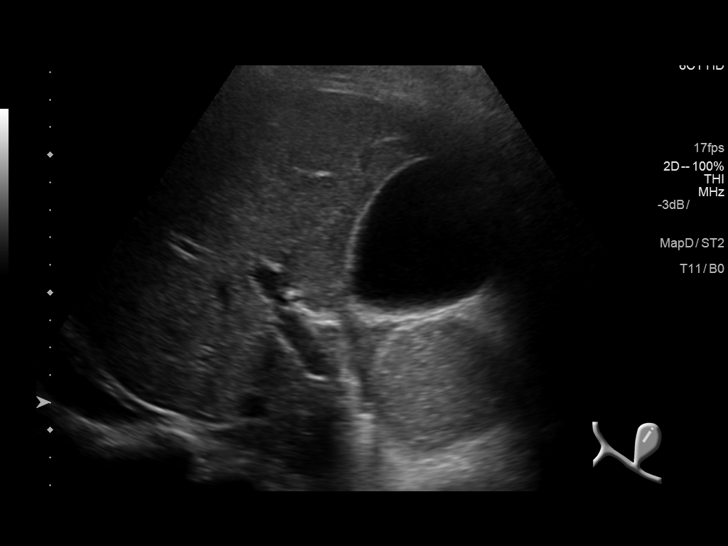
[im 9/50]
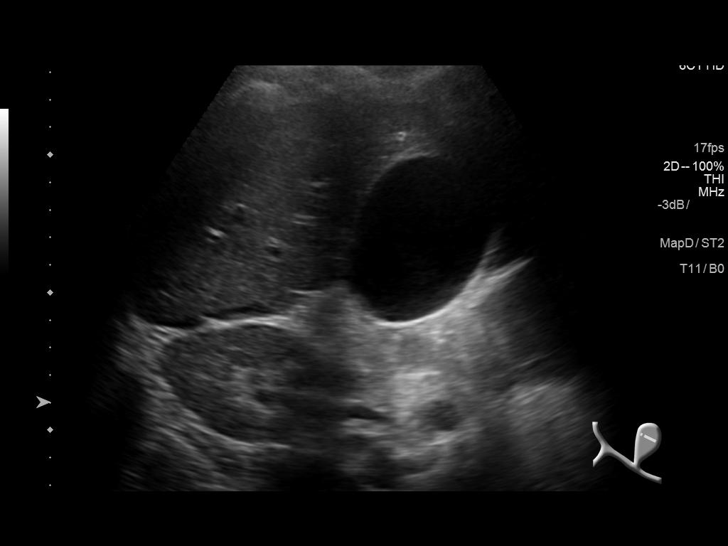
[im 13/50]
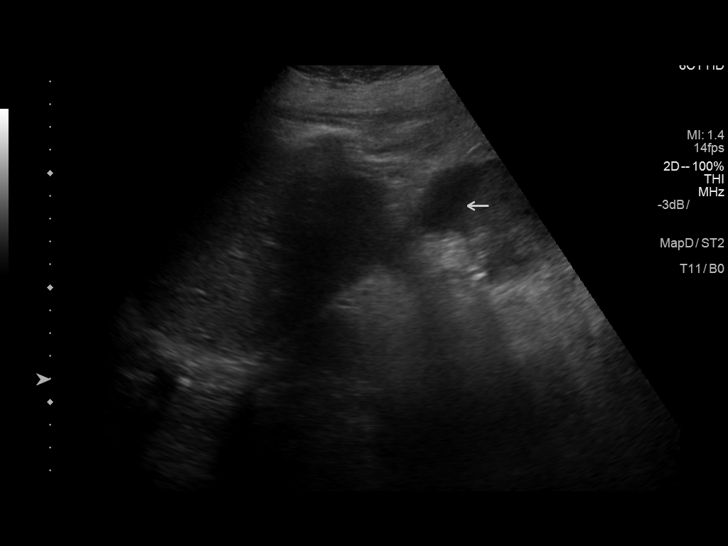
[im 17/50]
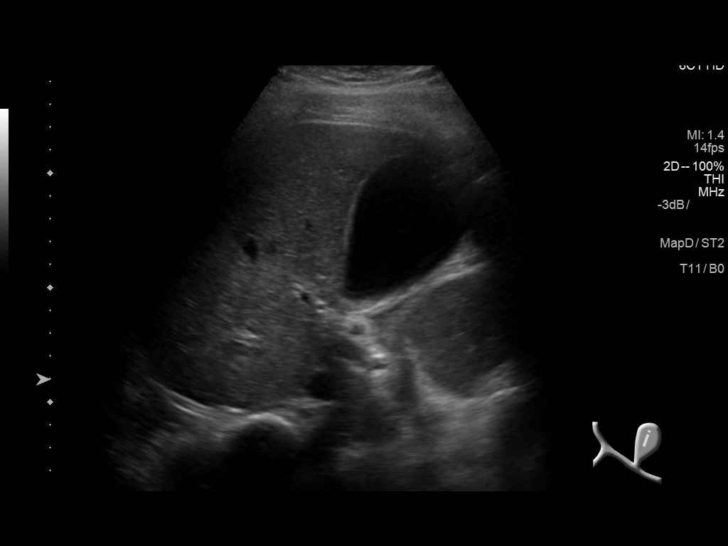
[im 21/50]
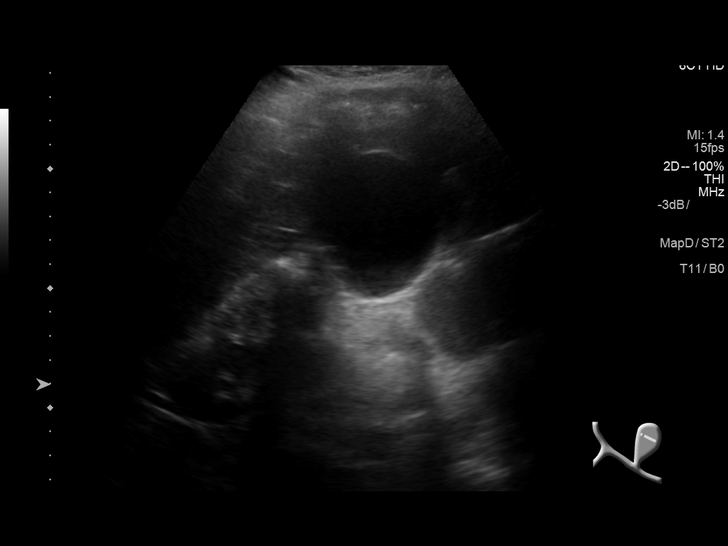
[im 25/50]
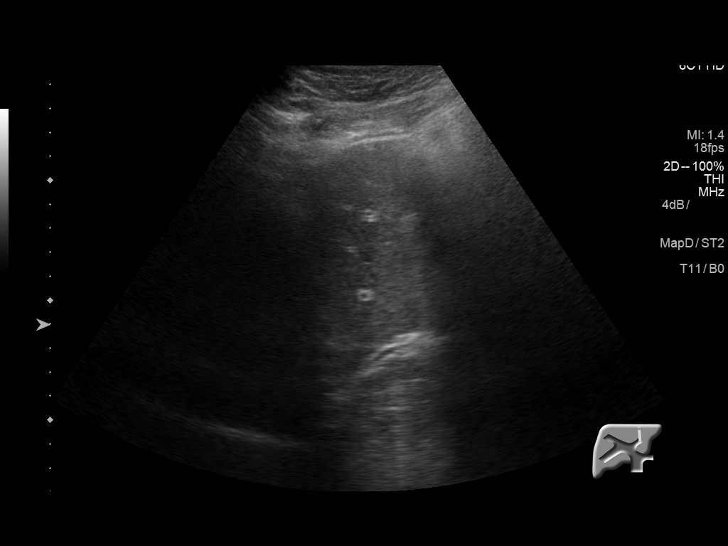
[im 29/50]
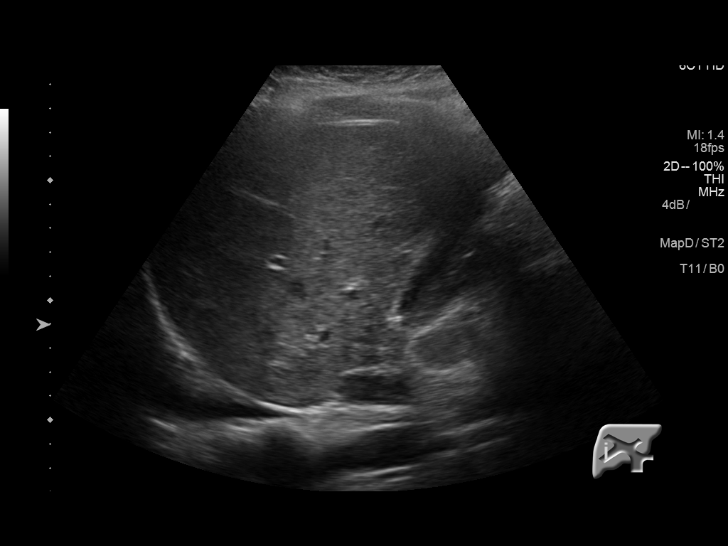
[im 33/50]
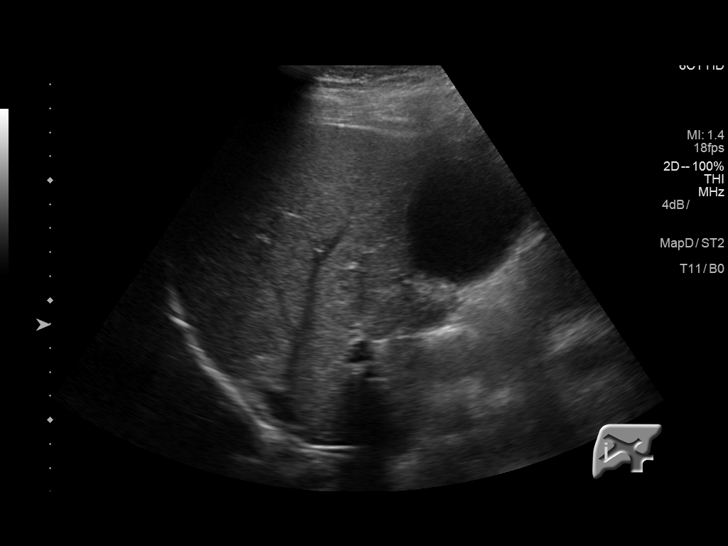
[im 37/50]
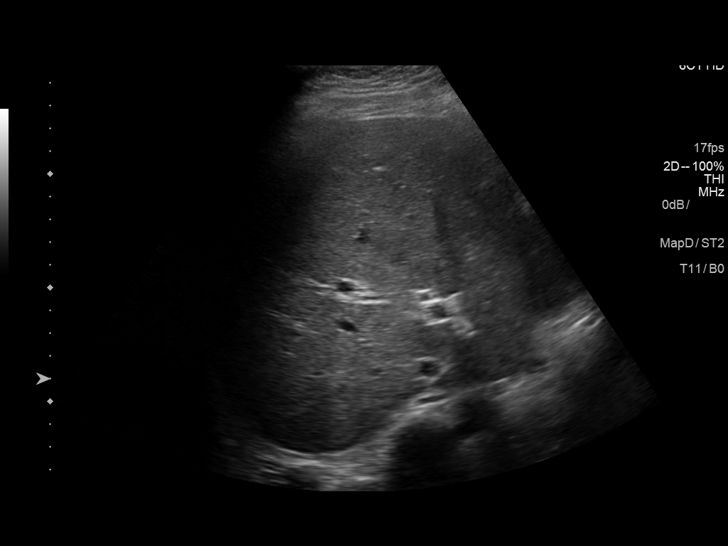
[im 41/50]
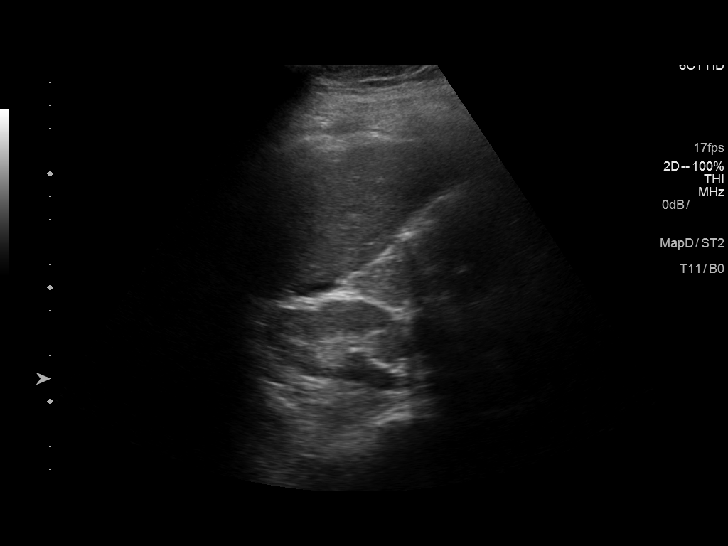
[im 45/50]
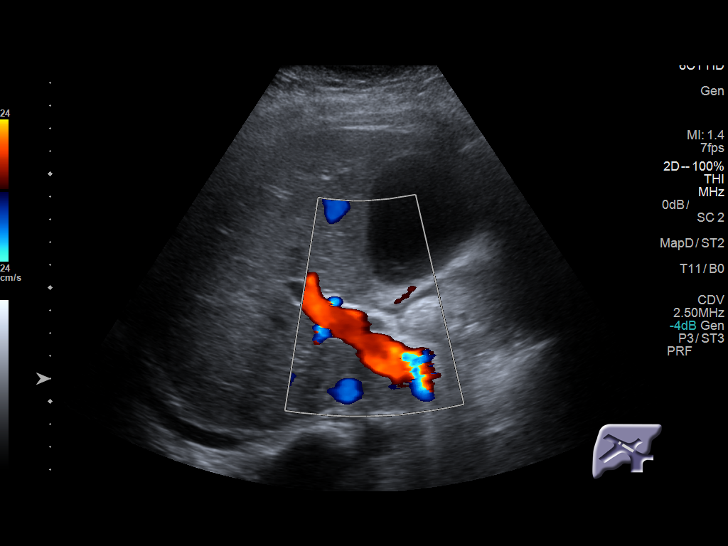
[im 50/50]
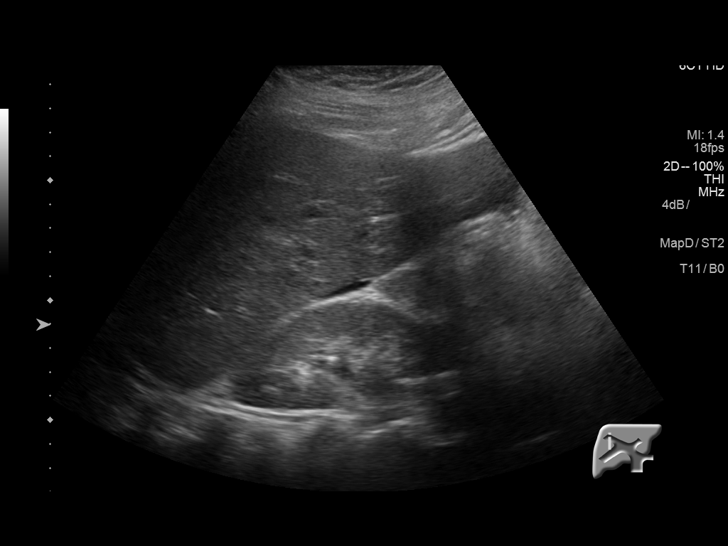

[13 of 25 positions shown; findings below may reference images not displayed]

FINDINGS: Gallbladder:

Gallbladder is mildly distended but otherwise unremarkable. No
gallstones seen. No gallbladder wall thickening or pericholecystic
fluid. No sonographic Murphy's sign elicited, per the sonographer.

Common bile duct:

Diameter: Within normal limits at 4 mm.

Liver:

No focal lesion identified. Within normal limits in parenchymal
echogenicity.

Small amount of ascites noted throughout the abdomen.

Pericardial fluid noted at the heart base.

Pleural fluid noted at the right lung base.

Mildly prominent fluid-filled small bowel loops noted within the
lower abdomen, perhaps a post surgical ileus.
IMPRESSION: 1. Gallbladder appears normal. No evidence of cholecystitis. No bile
duct dilatation.
2. Liver appears normal.
3. Small amount of ascites.
4. Pericardial fluid noted at the heart base, of uncertain amount.
Would consider correlation with ECHO.
5. Pleural effusion at the right lung base, also of uncertain
amount.
6. Mildly prominent fluid-filled small bowel loops within the lower
abdomen, perhaps postsurgical ileus.
These results will be called to the ordering clinician or
representative by the Radiologist Assistant, and communication
documented in the PACS or zVision Dashboard.
